# Patient Record
Sex: Female | Born: 1965 | Race: Black or African American | Hispanic: No | Marital: Single | Smoking: Never smoker
Health system: Southern US, Community
[De-identification: ages and names within clinical notes are randomized; demographics above are authoritative.]

## PROBLEM LIST (undated history)

## (undated) DIAGNOSIS — M25862 Other specified joint disorders, left knee: Secondary | ICD-10-CM

## (undated) DIAGNOSIS — L819 Disorder of pigmentation, unspecified: Secondary | ICD-10-CM

## (undated) DIAGNOSIS — M25561 Pain in right knee: Secondary | ICD-10-CM

## (undated) DIAGNOSIS — L709 Acne, unspecified: Secondary | ICD-10-CM

## (undated) DIAGNOSIS — L732 Hidradenitis suppurativa: Secondary | ICD-10-CM

## (undated) DIAGNOSIS — M25562 Pain in left knee: Secondary | ICD-10-CM

## (undated) DIAGNOSIS — M1990 Unspecified osteoarthritis, unspecified site: Secondary | ICD-10-CM

## (undated) HISTORY — PX: COLONOSCOPY: SHX174

## (undated) HISTORY — PX: THERAPEUTIC ABORTION: SHX798

---

## 2001-03-19 ENCOUNTER — Inpatient Hospital Stay (HOSPITAL_COMMUNITY): Admission: AD | Admit: 2001-03-19 | Discharge: 2001-03-19 | Payer: Self-pay | Admitting: Obstetrics and Gynecology

## 2002-01-27 ENCOUNTER — Other Ambulatory Visit: Admission: RE | Admit: 2002-01-27 | Discharge: 2002-01-27 | Payer: Self-pay | Admitting: Obstetrics and Gynecology

## 2003-05-07 ENCOUNTER — Other Ambulatory Visit: Admission: RE | Admit: 2003-05-07 | Discharge: 2003-05-07 | Payer: Self-pay | Admitting: Obstetrics and Gynecology

## 2004-11-25 ENCOUNTER — Other Ambulatory Visit: Admission: RE | Admit: 2004-11-25 | Discharge: 2004-11-25 | Payer: Self-pay | Admitting: Family Medicine

## 2005-11-29 ENCOUNTER — Other Ambulatory Visit: Admission: RE | Admit: 2005-11-29 | Discharge: 2005-11-29 | Payer: Self-pay | Admitting: Family Medicine

## 2007-01-28 ENCOUNTER — Other Ambulatory Visit: Admission: RE | Admit: 2007-01-28 | Discharge: 2007-01-28 | Payer: Self-pay | Admitting: Family Medicine

## 2008-05-19 ENCOUNTER — Other Ambulatory Visit: Admission: RE | Admit: 2008-05-19 | Discharge: 2008-05-19 | Payer: Self-pay | Admitting: Family Medicine

## 2010-10-31 ENCOUNTER — Other Ambulatory Visit: Admission: RE | Admit: 2010-10-31 | Discharge: 2010-10-31 | Payer: Self-pay | Admitting: Family Medicine

## 2013-03-10 ENCOUNTER — Other Ambulatory Visit (HOSPITAL_COMMUNITY)
Admission: RE | Admit: 2013-03-10 | Discharge: 2013-03-10 | Disposition: A | Discharge status: Home / Self Care | Payer: BC Managed Care – PPO | Source: Ambulatory Visit | Attending: Family Medicine | Admitting: Family Medicine

## 2013-03-10 ENCOUNTER — Other Ambulatory Visit: Payer: Self-pay | Admitting: Family Medicine

## 2013-03-10 DIAGNOSIS — Z1151 Encounter for screening for human papillomavirus (HPV): Secondary | ICD-10-CM | POA: Insufficient documentation

## 2013-03-10 DIAGNOSIS — Z124 Encounter for screening for malignant neoplasm of cervix: Secondary | ICD-10-CM | POA: Insufficient documentation

## 2013-03-11 ENCOUNTER — Other Ambulatory Visit: Payer: Self-pay | Admitting: Family Medicine

## 2013-03-11 DIAGNOSIS — N926 Irregular menstruation, unspecified: Secondary | ICD-10-CM

## 2013-03-19 ENCOUNTER — Ambulatory Visit
Admission: RE | Admit: 2013-03-19 | Discharge: 2013-03-19 | Disposition: A | Discharge status: Home / Self Care | Payer: BC Managed Care – PPO | Source: Ambulatory Visit | Attending: Family Medicine | Admitting: Family Medicine

## 2013-03-19 DIAGNOSIS — N926 Irregular menstruation, unspecified: Secondary | ICD-10-CM

## 2015-06-05 ENCOUNTER — Encounter: Payer: Self-pay | Admitting: Emergency Medicine

## 2015-06-05 ENCOUNTER — Emergency Department
Admission: EM | Admit: 2015-06-05 | Discharge: 2015-06-05 | Disposition: A | Discharge status: Home / Self Care | Payer: BLUE CROSS/BLUE SHIELD | Source: Home / Self Care | Attending: Family Medicine | Admitting: Family Medicine

## 2015-06-05 ENCOUNTER — Emergency Department (INDEPENDENT_AMBULATORY_CARE_PROVIDER_SITE_OTHER): Payer: BLUE CROSS/BLUE SHIELD

## 2015-06-05 DIAGNOSIS — J069 Acute upper respiratory infection, unspecified: Secondary | ICD-10-CM | POA: Diagnosis not present

## 2015-06-05 DIAGNOSIS — R0781 Pleurodynia: Secondary | ICD-10-CM

## 2015-06-05 DIAGNOSIS — R05 Cough: Secondary | ICD-10-CM

## 2015-06-05 MED ORDER — BENZONATATE 100 MG PO CAPS: 100.0000 mg | ORAL_CAPSULE | Freq: Three times a day (TID) | ORAL | Status: DC

## 2015-06-05 MED ORDER — SALINE SPRAY 0.65 % NA SOLN: 1.0000 | NASAL | Status: DC | PRN

## 2015-06-05 MED ORDER — NAPROXEN 500 MG PO TABS: 500.0000 mg | ORAL_TABLET | Freq: Two times a day (BID) | ORAL | Status: DC

## 2015-06-05 NOTE — ED Provider Notes (Signed)
CSN: [id number]     Arrival date & time 06/05/15  1803 History   First MD Initiated Contact with Patient 06/05/15 1823     Chief Complaint  Patient presents with  . Cough  . Fever   (Consider location/radiation/quality/duration/timing/severity/associated sxs/prior Treatment) HPI Patient is a 49 year old female presenting to urgent care with complaint of gradually worsening three-day history of subjective fever, cough, congestion, and body aches. Bilateral foot pain and back pain. Pain is described as aching sore with cough but denies difficulty breathing.  Pain is moderate in severity. Cough is moderate in severity with green and yellow sputum with tinges of red blood. Denies sick contacts or recent travel. Patient has tried over-the-counter cold and congestion medications without relief. Denies nausea vomiting diarrhea. Denies neck pain. Denies rashes. No tick bites.  History reviewed. No pertinent past medical history. History reviewed. No pertinent past surgical history. History reviewed. No pertinent family history. History  Substance Use Topics  . Smoking status: Never Smoker   . Smokeless tobacco: Not on file  . Alcohol Use: No   OB History    No data available     Review of Systems  Constitutional: Positive for fever, chills, diaphoresis, appetite change and fatigue. Negative for unexpected weight change.  HENT: Positive for congestion, sinus pressure, sore throat and voice change ( hoarseness  ). Negative for ear pain and trouble swallowing.   Respiratory: Positive for cough. Negative for shortness of breath.   Cardiovascular: Positive for chest pain (bilateral ribs). Negative for palpitations and leg swelling.  Gastrointestinal: Negative for nausea, vomiting, abdominal pain and diarrhea.  Musculoskeletal: Positive for myalgias, back pain and arthralgias. Negative for gait problem, neck pain and neck stiffness.    Allergies  Review of patient's allergies indicates not on  file.  Home Medications   Prior to Admission medications   Medication Sig Start Date End Date Taking? Authorizing Provider  benzonatate (TESSALON) 100 MG capsule Take 1 capsule (100 mg total) by mouth every 8 (eight) hours. 06/05/15   Noland Fordyce, PA-C  naproxen (NAPROSYN) 500 MG tablet Take 1 tablet (500 mg total) by mouth 2 (two) times daily. 06/05/15   Noland Fordyce, PA-C  sodium chloride (OCEAN) 0.65 % SOLN nasal spray Place 1 spray into both nostrils as needed for congestion. 06/05/15   Noland Fordyce, PA-C   Pulse 66  Temp(Src) 98 F (36.7 C) (Oral)  Ht 5\' 10"  (1.778 m)  Wt 219 lb (99.338 kg)  BMI 31.42 kg/m2  SpO2 96%  LMP 05/26/2015 Physical Exam  Constitutional: She appears well-developed and well-nourished. No distress.  HENT:  Head: Normocephalic and atraumatic.  Right Ear: Hearing, tympanic membrane, external ear and ear canal normal.  Left Ear: Hearing, tympanic membrane, external ear and ear canal normal.  Nose: Mucosal edema present.  Mouth/Throat: Uvula is midline and mucous membranes are normal. Posterior oropharyngeal erythema present. No oropharyngeal exudate, posterior oropharyngeal edema or tonsillar abscesses.  Eyes: Conjunctivae are normal. No scleral icterus.  Neck: Normal range of motion. Neck supple.  Cardiovascular: Normal rate, regular rhythm and normal heart sounds.   Pulmonary/Chest: Effort normal and breath sounds normal. No respiratory distress. She has no wheezes. She has no rales. She exhibits no tenderness.  No respiratory distress, able to speak in full sentences w/o difficulty. Lungs: CTAB. No chest wall tenderness  Abdominal: Soft. Bowel sounds are normal. She exhibits no distension and no mass. There is no tenderness. There is no rebound and no guarding.  Musculoskeletal: Normal range  of motion.  Neurological: She is alert.  Skin: Skin is warm and dry. She is not diaphoretic.  Nursing note and vitals reviewed.   ED Course  Procedures (including  critical care time) Labs Review Labs Reviewed - No data to display  Imaging Review Dg Chest 2 View  06/05/2015   CLINICAL DATA:  Acute onset of productive cough, bilateral rib pain and bilateral back pain. Initial encounter.  EXAM: CHEST  2 VIEW  COMPARISON:  None.  FINDINGS: The lungs are well-aerated. Pulmonary vascularity is at the upper limits of normal. There is no evidence of focal opacification, pleural effusion or pneumothorax.  The heart is normal in size; the mediastinal contour is within normal limits. No acute osseous abnormalities are seen.  IMPRESSION: No acute cardiopulmonary process seen.   Electronically Signed   By: Garald Balding M.D.   On: 06/05/2015 18:41     MDM   1. Acute upper respiratory infection     Patient complains of productive cough, congestion, bilateral chest pain, back pain, body aches, subjective fever. Has tried OTC medication without relief. Due to reports of fever chest pain and productive cough, there is concern for pneumonia. Chest x-ray.    CXR: no acute cardiopulmonary process seen.  Will tx symptomatically for URI. Home care instructions provided. Advised pt to use acetaminophen and ibuprofen as needed for fever and pain. Encouraged rest and fluids. Return precautions provided. Pt verbalized understanding and agreement with tx plan.    Noland Fordyce, PA-C 06/05/15 [phone]

## 2015-06-05 NOTE — ED Notes (Signed)
Cough cold and congestion with fever times 3 days. Complains of pain in bilateral ribs and back

## 2015-06-05 NOTE — Discharge Instructions (Signed)
You may take Ibuprofen (Motrin) every 6-8 hours for fever and pain  Alternate with Tylenol  You may take Tylenol every 4-6 hours as needed for fever and pain  Follow-up with your primary care provider next week for recheck of symptoms if not improving.  Be sure to drink plenty of fluids and rest, at least 8hrs of sleep a night, preferably more while you are sick. Please go to the ED if you cannot keep down fluids/signs of dehydration, fever not reducing with Tylenol, difficulty breathing/wheezing, stiff neck, worsening condition, or other concerns (see below)  [employer] may help relieve the symptoms of a cough and cold. They add moisture to the air, which helps mucus to become thinner and less sticky. This makes it easier to breathe and cough up secretions. Cool mist vaporizers do not cause serious burns like hot mist vaporizers, which may also be called steamers or humidifiers. Vaporizers have not been proven to help with colds. You should not use a vaporizer if you are allergic to mold. HOME CARE INSTRUCTIONS  Follow the package instructions for the vaporizer.  Do not use anything other than distilled water in the vaporizer.  Do not run the vaporizer all of the time. This can cause mold or bacteria to grow in the vaporizer.  Clean the vaporizer after each time it is used.  Clean and dry the vaporizer well before storing it.  Stop using the vaporizer if worsening respiratory symptoms develop. Document Released: 08/17/2004 Document Revised: 11/25/2013 Document Reviewed: 04/09/2013 [facility] Patient Information 2015 [place], [place]. This information is not intended to replace advice given to you by your health care provider. Make sure you discuss any questions you have with your health care provider.  Upper Respiratory Infection, Adult An upper respiratory infection (URI) is also sometimes known as the common cold. The upper respiratory tract includes the nose, sinuses,  throat, trachea, and bronchi. Bronchi are the airways leading to the lungs. Most people improve within 1 week, but symptoms can last up to 2 weeks. A residual cough may last even longer.  CAUSES Many different viruses can infect the tissues lining the upper respiratory tract. The tissues become irritated and inflamed and often become very moist. Mucus production is also common. A cold is contagious. You can easily spread the virus to others by oral contact. This includes kissing, sharing a glass, coughing, or sneezing. Touching your mouth or nose and then touching a surface, which is then touched by another person, can also spread the virus. SYMPTOMS  Symptoms typically develop 1 to 3 days after you come in contact with a cold virus. Symptoms vary from person to person. They may include:  Runny nose.  Sneezing.  Nasal congestion.  Sinus irritation.  Sore throat.  Loss of voice (laryngitis).  Cough.  Fatigue.  Muscle aches.  Loss of appetite.  Headache.  Low-grade fever. DIAGNOSIS  You might diagnose your own cold based on familiar symptoms, since most people get a cold 2 to 3 times a year. Your caregiver can confirm this based on your exam. Most importantly, your caregiver can check that your symptoms are not due to another disease such as strep throat, sinusitis, pneumonia, asthma, or epiglottitis. Blood tests, throat tests, and X-rays are not necessary to diagnose a common cold, but they may sometimes be helpful in excluding other more serious diseases. Your caregiver will decide if any further tests are required. RISKS AND COMPLICATIONS  You may be at risk for a more  severe case of the common cold if you smoke cigarettes, have chronic heart disease (such as heart failure) or lung disease (such as asthma), or if you have a weakened immune system. The very young and very old are also at risk for more serious infections. Bacterial sinusitis, middle ear infections, and bacterial  pneumonia can complicate the common cold. The common cold can worsen asthma and chronic obstructive pulmonary disease (COPD). Sometimes, these complications can require emergency medical care and may be life-threatening. PREVENTION  The best way to protect against getting a cold is to practice good hygiene. Avoid oral or hand contact with people with cold symptoms. Wash your hands often if contact occurs. There is no clear evidence that vitamin C, vitamin E, echinacea, or exercise reduces the chance of developing a cold. However, it is always recommended to get plenty of rest and practice good nutrition. TREATMENT  Treatment is directed at relieving symptoms. There is no cure. Antibiotics are not effective, because the infection is caused by a virus, not by bacteria. Treatment may include:  Increased fluid intake. Sports drinks offer valuable electrolytes, sugars, and fluids.  Breathing heated mist or steam (vaporizer or shower).  Eating chicken soup or other clear broths, and maintaining good nutrition.  Getting plenty of rest.  Using gargles or lozenges for comfort.  Controlling fevers with ibuprofen or acetaminophen as directed by your caregiver.  Increasing usage of your inhaler if you have asthma. Zinc gel and zinc lozenges, taken in the first 24 hours of the common cold, can shorten the duration and lessen the severity of symptoms. Pain medicines may help with fever, muscle aches, and throat pain. A variety of non-prescription medicines are available to treat congestion and runny nose. Your caregiver can make recommendations and may suggest nasal or lung inhalers for other symptoms.  HOME CARE INSTRUCTIONS   Only take over-the-counter or prescription medicines for pain, discomfort, or fever as directed by your caregiver.  Use a warm mist humidifier or inhale steam from a shower to increase air moisture. This may keep secretions moist and make it easier to breathe.  Drink enough water  and fluids to keep your urine clear or pale yellow.  Rest as needed.  Return to work when your temperature has returned to normal or as your caregiver advises. You may need to stay home longer to avoid infecting others. You can also use a face mask and careful hand washing to prevent spread of the virus. SEEK MEDICAL CARE IF:   After the first few days, you feel you are getting worse rather than better.  You need your caregiver's advice about medicines to control symptoms.  You develop chills, worsening shortness of breath, or brown or red sputum. These may be signs of pneumonia.  You develop yellow or brown nasal discharge or pain in the face, especially when you bend forward. These may be signs of sinusitis.  You develop a fever, swollen neck glands, pain with swallowing, or white areas in the back of your throat. These may be signs of strep throat. SEEK IMMEDIATE MEDICAL CARE IF:   You have a fever.  You develop severe or persistent headache, ear pain, sinus pain, or chest pain.  You develop wheezing, a prolonged cough, cough up blood, or have a change in your usual mucus (if you have chronic lung disease).  You develop sore muscles or a stiff neck. Document Released: 05/16/2001 Document Revised: 02/12/2012 Document Reviewed: 02/25/2014 [facility] Patient Information 2015 [place], [place]. This information  is not intended to replace advice given to you by your health care provider. Make sure you discuss any questions you have with your health care provider.

## 2016-04-25 ENCOUNTER — Other Ambulatory Visit: Payer: Self-pay | Admitting: Family Medicine

## 2016-04-25 DIAGNOSIS — N926 Irregular menstruation, unspecified: Secondary | ICD-10-CM

## 2016-05-05 ENCOUNTER — Ambulatory Visit
Admission: RE | Admit: 2016-05-05 | Discharge: 2016-05-05 | Disposition: A | Discharge status: Home / Self Care | Payer: BLUE CROSS/BLUE SHIELD | Source: Ambulatory Visit | Attending: Family Medicine | Admitting: Family Medicine

## 2016-05-05 DIAGNOSIS — N926 Irregular menstruation, unspecified: Secondary | ICD-10-CM

## 2017-01-02 DIAGNOSIS — L732 Hidradenitis suppurativa: Secondary | ICD-10-CM | POA: Diagnosis not present

## 2017-01-02 DIAGNOSIS — L819 Disorder of pigmentation, unspecified: Secondary | ICD-10-CM | POA: Diagnosis not present

## 2017-01-02 DIAGNOSIS — L700 Acne vulgaris: Secondary | ICD-10-CM | POA: Diagnosis not present

## 2017-06-14 ENCOUNTER — Other Ambulatory Visit: Payer: Self-pay | Admitting: Family Medicine

## 2017-06-14 DIAGNOSIS — N926 Irregular menstruation, unspecified: Secondary | ICD-10-CM | POA: Diagnosis not present

## 2017-06-14 DIAGNOSIS — Z23 Encounter for immunization: Secondary | ICD-10-CM | POA: Diagnosis not present

## 2017-06-14 DIAGNOSIS — N921 Excessive and frequent menstruation with irregular cycle: Secondary | ICD-10-CM | POA: Diagnosis not present

## 2017-06-15 DIAGNOSIS — Z0000 Encounter for general adult medical examination without abnormal findings: Secondary | ICD-10-CM | POA: Diagnosis not present

## 2017-06-15 DIAGNOSIS — N938 Other specified abnormal uterine and vaginal bleeding: Secondary | ICD-10-CM | POA: Diagnosis not present

## 2017-06-21 DIAGNOSIS — Z23 Encounter for immunization: Secondary | ICD-10-CM | POA: Diagnosis not present

## 2017-06-22 DIAGNOSIS — N920 Excessive and frequent menstruation with regular cycle: Secondary | ICD-10-CM | POA: Diagnosis not present

## 2017-06-22 DIAGNOSIS — N938 Other specified abnormal uterine and vaginal bleeding: Secondary | ICD-10-CM | POA: Diagnosis not present

## 2017-06-22 DIAGNOSIS — D259 Leiomyoma of uterus, unspecified: Secondary | ICD-10-CM | POA: Diagnosis not present

## 2017-06-26 ENCOUNTER — Other Ambulatory Visit: Payer: Self-pay | Admitting: Family Medicine

## 2017-06-26 DIAGNOSIS — N926 Irregular menstruation, unspecified: Secondary | ICD-10-CM

## 2017-07-02 DIAGNOSIS — D259 Leiomyoma of uterus, unspecified: Secondary | ICD-10-CM | POA: Diagnosis not present

## 2017-07-02 DIAGNOSIS — N926 Irregular menstruation, unspecified: Secondary | ICD-10-CM | POA: Diagnosis not present

## 2017-07-26 DIAGNOSIS — Z23 Encounter for immunization: Secondary | ICD-10-CM | POA: Diagnosis not present

## 2017-08-03 DIAGNOSIS — Z1231 Encounter for screening mammogram for malignant neoplasm of breast: Secondary | ICD-10-CM | POA: Diagnosis not present

## 2017-08-07 DIAGNOSIS — Z131 Encounter for screening for diabetes mellitus: Secondary | ICD-10-CM | POA: Diagnosis not present

## 2017-08-07 DIAGNOSIS — D508 Other iron deficiency anemias: Secondary | ICD-10-CM | POA: Diagnosis not present

## 2017-08-07 DIAGNOSIS — Z23 Encounter for immunization: Secondary | ICD-10-CM | POA: Diagnosis not present

## 2017-08-07 DIAGNOSIS — Z0000 Encounter for general adult medical examination without abnormal findings: Secondary | ICD-10-CM | POA: Diagnosis not present

## 2017-08-13 ENCOUNTER — Other Ambulatory Visit: Payer: Self-pay | Admitting: Obstetrics and Gynecology

## 2017-08-13 ENCOUNTER — Other Ambulatory Visit (HOSPITAL_COMMUNITY)
Admission: RE | Admit: 2017-08-13 | Discharge: 2017-08-13 | Disposition: A | Discharge status: Home / Self Care | Payer: BLUE CROSS/BLUE SHIELD | Source: Ambulatory Visit | Attending: Obstetrics and Gynecology | Admitting: Obstetrics and Gynecology

## 2017-08-13 DIAGNOSIS — N939 Abnormal uterine and vaginal bleeding, unspecified: Secondary | ICD-10-CM | POA: Diagnosis not present

## 2017-08-13 DIAGNOSIS — R87810 Cervical high risk human papillomavirus (HPV) DNA test positive: Secondary | ICD-10-CM | POA: Insufficient documentation

## 2017-08-13 DIAGNOSIS — Z124 Encounter for screening for malignant neoplasm of cervix: Secondary | ICD-10-CM | POA: Diagnosis not present

## 2017-08-13 DIAGNOSIS — D219 Benign neoplasm of connective and other soft tissue, unspecified: Secondary | ICD-10-CM | POA: Diagnosis not present

## 2017-08-16 LAB — CYTOLOGY - PAP
Diagnosis: NEGATIVE
HPV 16/18/45 GENOTYPING: NEGATIVE
HPV: DETECTED — AB

## 2017-08-21 DIAGNOSIS — Z3202 Encounter for pregnancy test, result negative: Secondary | ICD-10-CM | POA: Diagnosis not present

## 2017-08-21 DIAGNOSIS — N9089 Other specified noninflammatory disorders of vulva and perineum: Secondary | ICD-10-CM | POA: Diagnosis not present

## 2017-08-21 DIAGNOSIS — N939 Abnormal uterine and vaginal bleeding, unspecified: Secondary | ICD-10-CM | POA: Diagnosis not present

## 2017-09-22 IMAGING — US US TRANSVAGINAL NON-OB
1 series · 13 of 25 positions shown · non-contrast
Comparison: Pelvic ultrasound March 19, 2013.

CLINICAL DATA: Painless irregular menses ; intermenstrual spotting
5 months ago, history of fibroids on past ultrasound.



[Series 1: us transvaginal non-ob · 0.20mm/px · 13 of 31 slices shown]
[im 1/31]
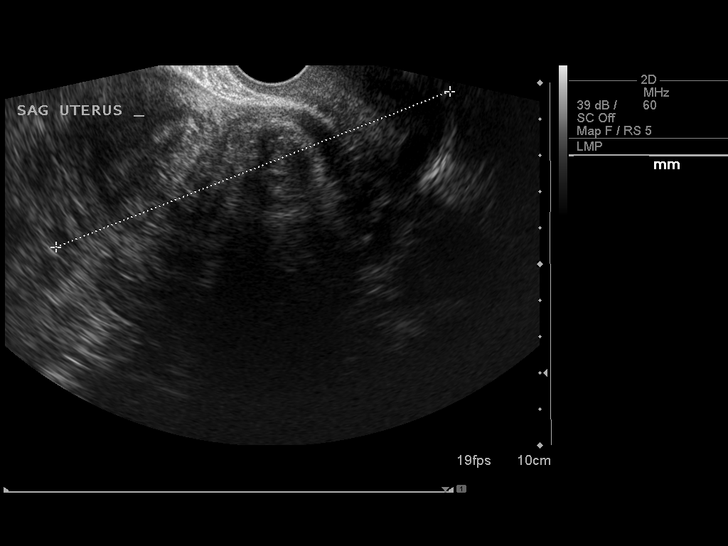
[im 3/31]
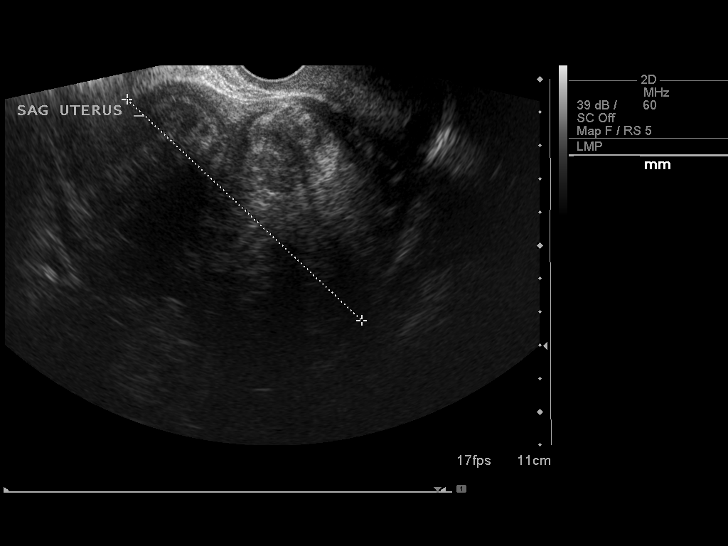
[im 6/31]
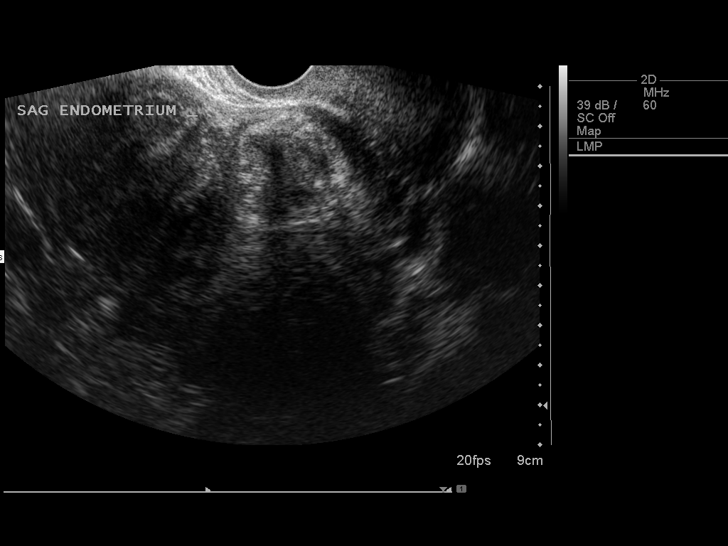
[im 8/31]
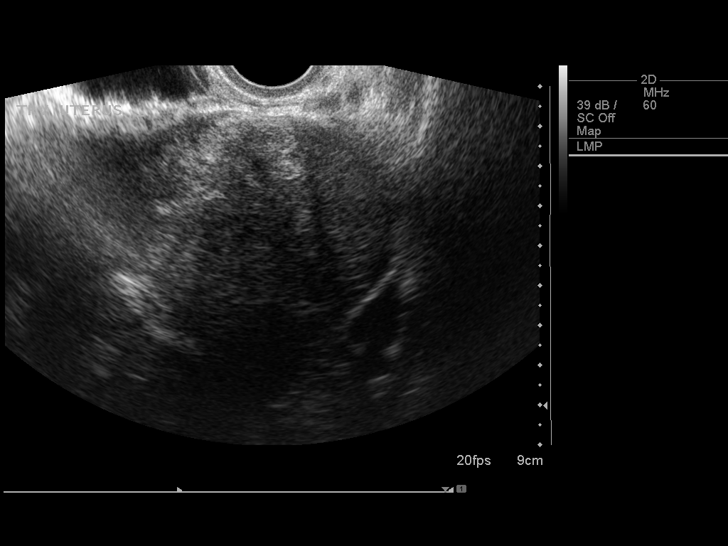
[im 11/31]
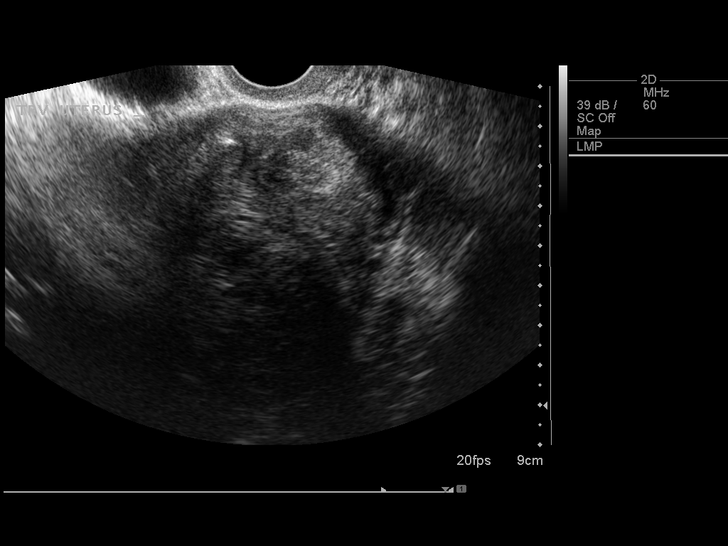
[im 13/31]
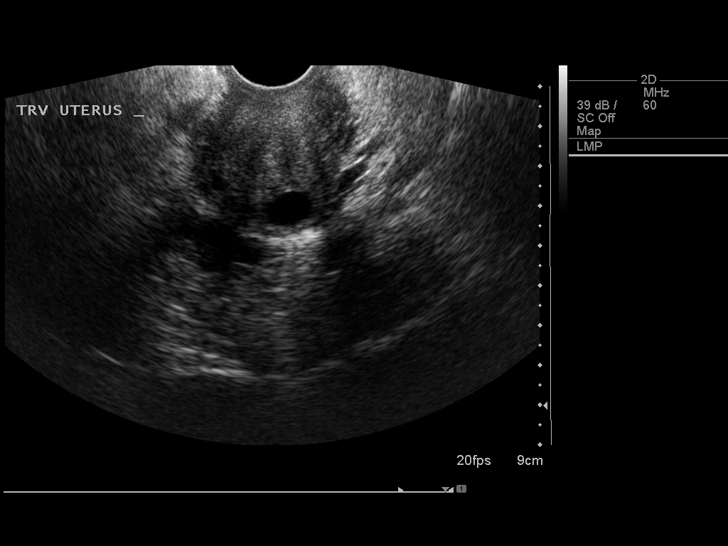
[im 16/31]
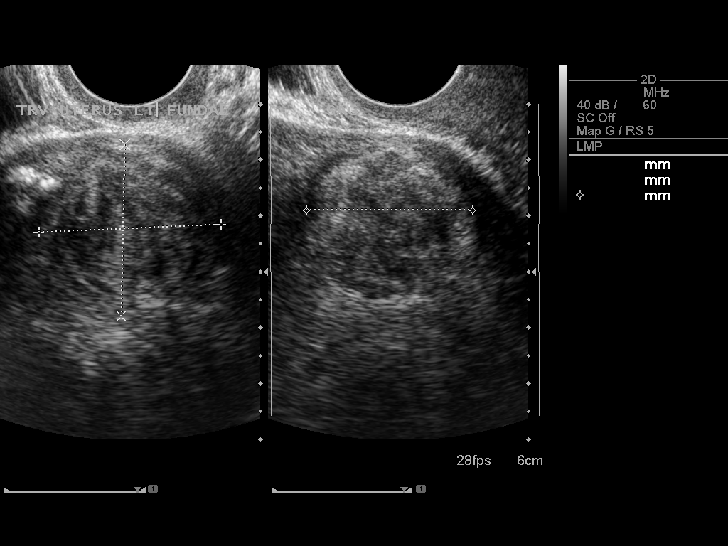
[im 18/31]
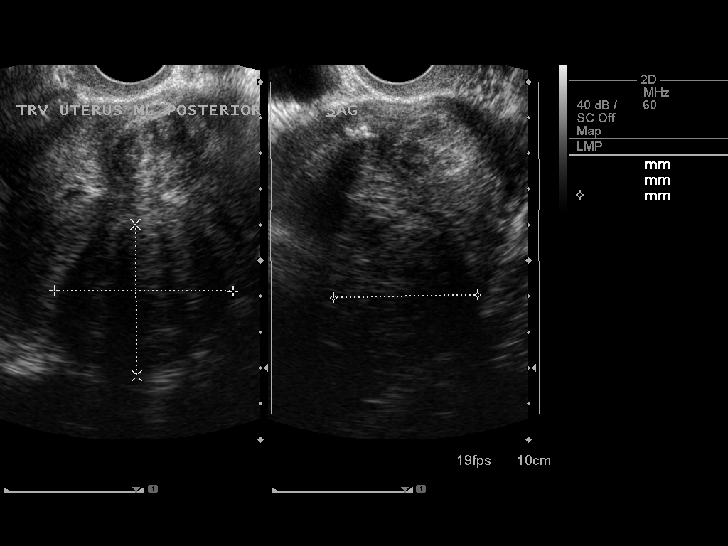
[im 21/31]
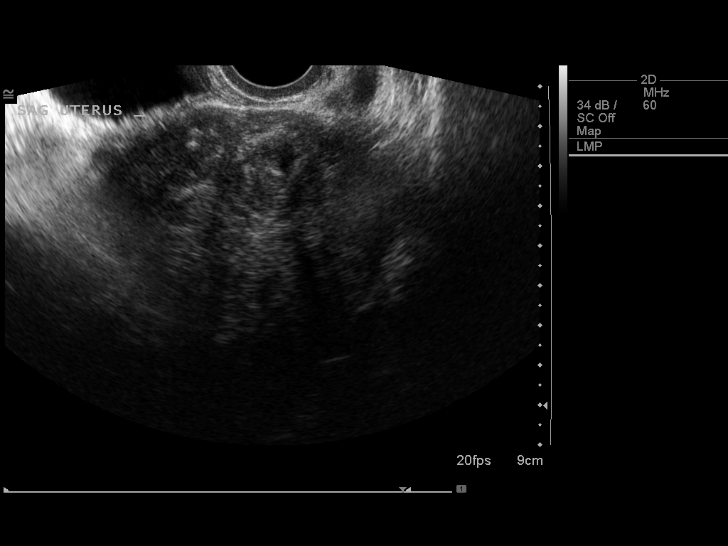
[im 23/31]
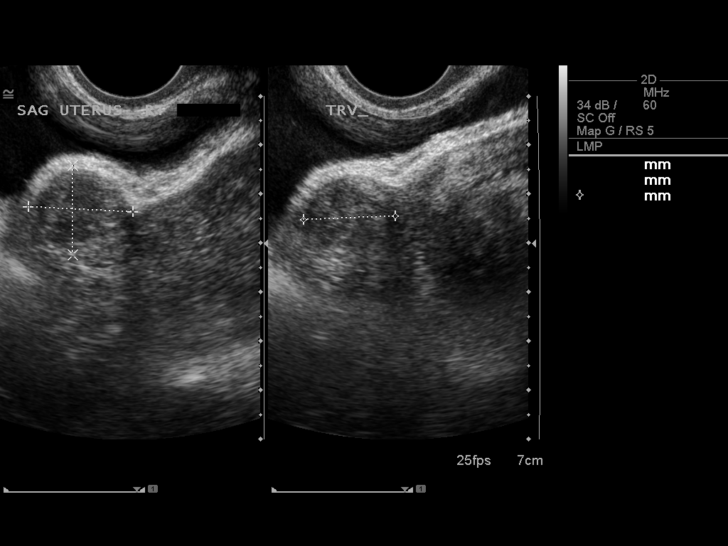
[im 26/31]
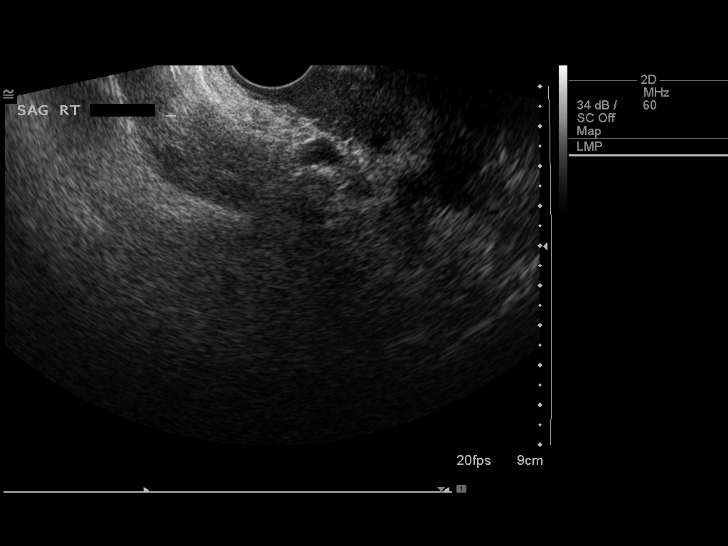
[im 28/31]
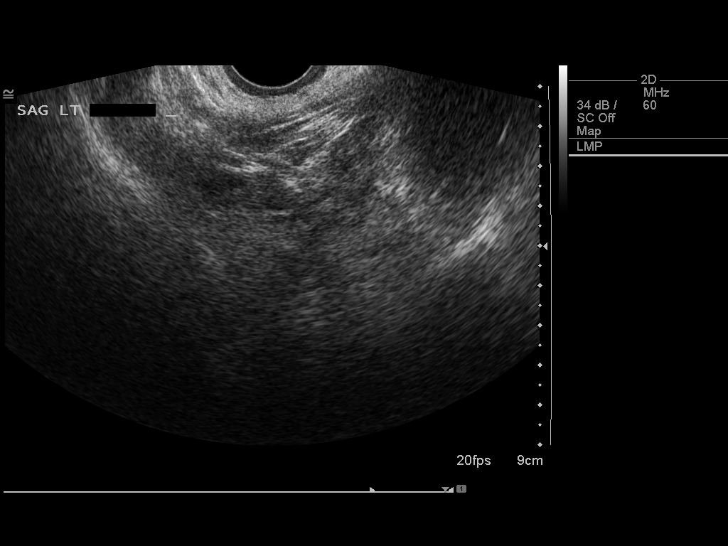
[im 31/31]
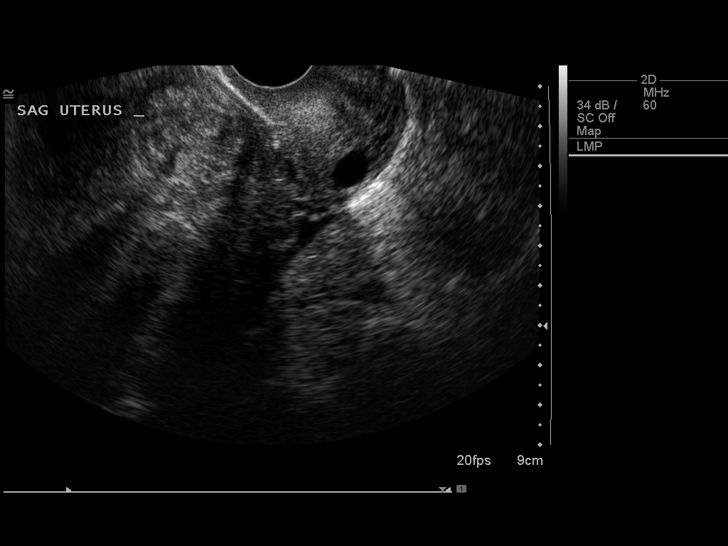

[13 of 25 positions shown; findings below may reference images not displayed]

FINDINGS: Uterus

Measurements: 12.5 x 11.6 x 10.0 cm. There are at least 7 measurable
fibroids. There is are sub serosal as well as intra myometrial and 7
endometrial. A pedunculated fibroid arising from the fundus measures
2.8 cm in greatest dimension. A subserosal fibroid in the mid to
lower uterine segment posteriorly measures 4.3 x 6.0 x 4.5 cm. Other
fibroids are smaller than this.

Endometrium

Thickness: 4.4 mm.  No focal abnormality visualized.

Neither the right nor left ovary could be observed.

Other findings

No abnormal free fluid.
IMPRESSION: 1. At least 7 discrete fibroids can be demonstrated and measured.
The largest measures 4.3 x 6.0 x 4.5 cm and lies in the posterior
aspect of the mid to lower uterine body and is sub serosal.
2. The endometrial stripe is normal at 4.4 mm.
3. The ovaries could not be demonstrated. There is no free pelvic
fluid. No suspicious adnexal masses are observed.

## 2017-09-22 IMAGING — US US TRANSVAGINAL NON-OB
1 series · 13 of 24 positions shown · non-contrast
Comparison: Pelvic ultrasound March 19, 2013.

CLINICAL DATA: Painless irregular menses ; intermenstrual spotting
5 months ago, history of fibroids on past ultrasound.



[Series 1: us transvaginal non-ob · 0.28mm/px · 13 of 24 slices shown]
[im 1/24]
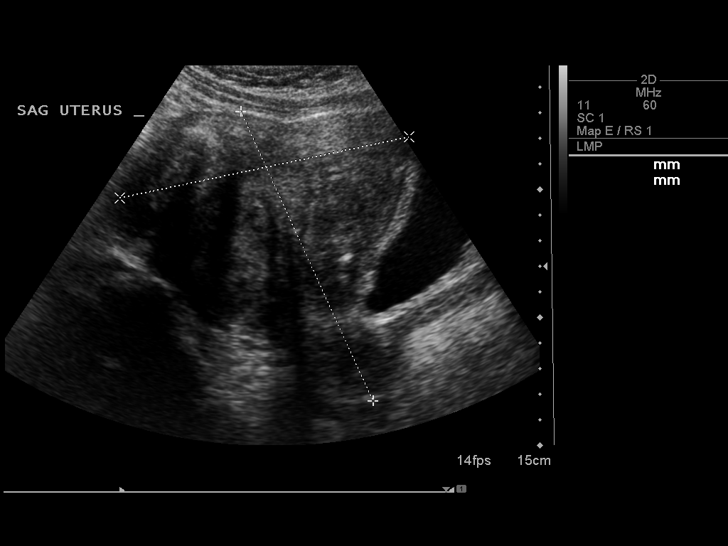
[im 3/24]
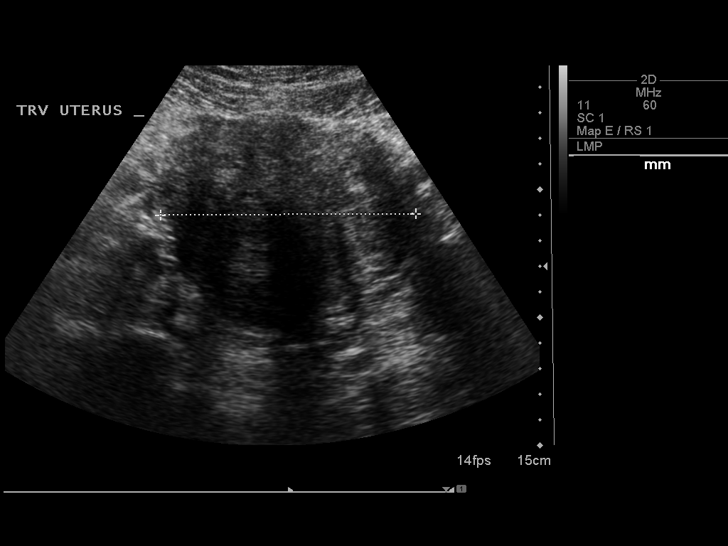
[im 5/24]
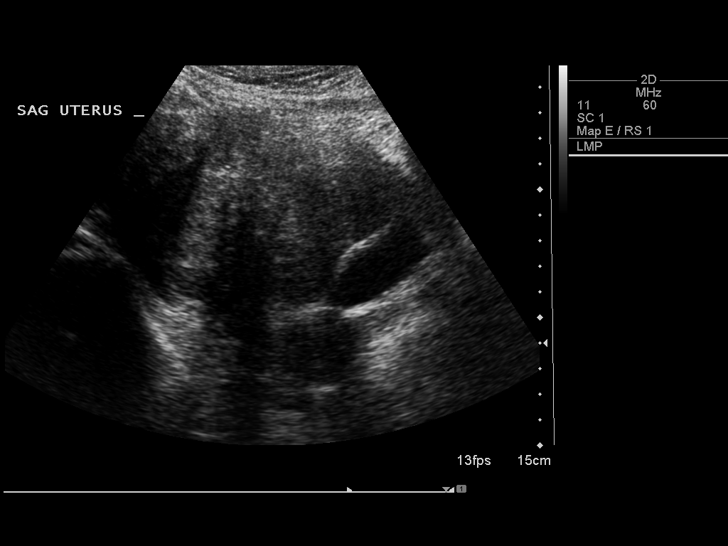
[im 7/24]
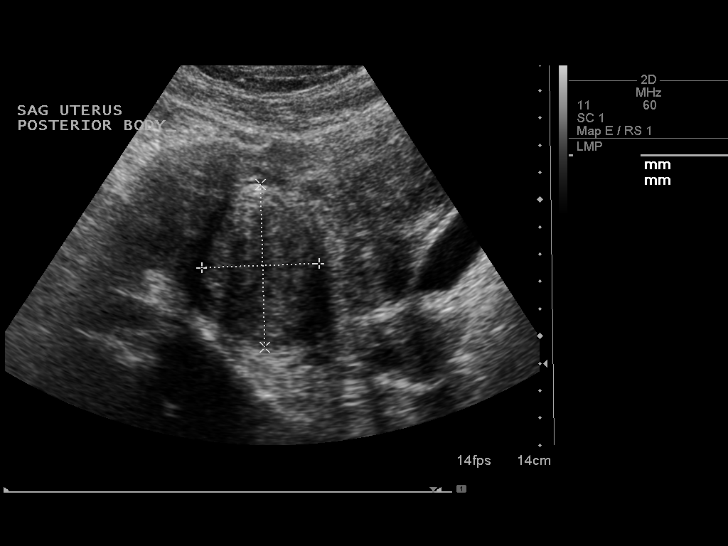
[im 9/24]
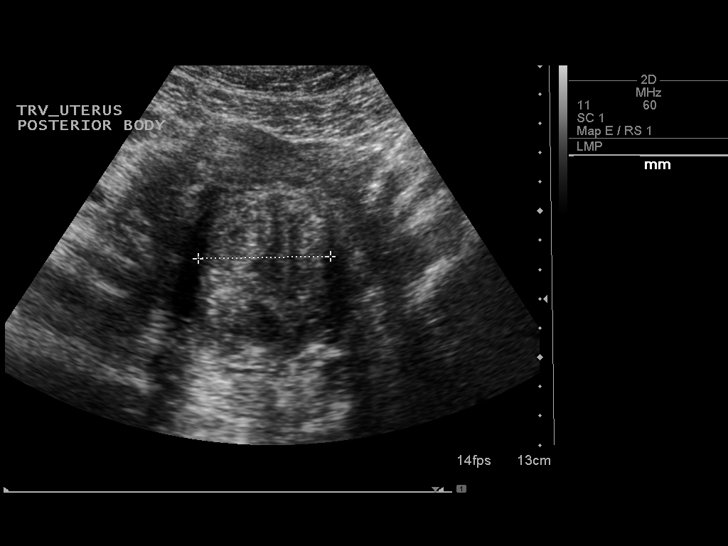
[im 11/24]
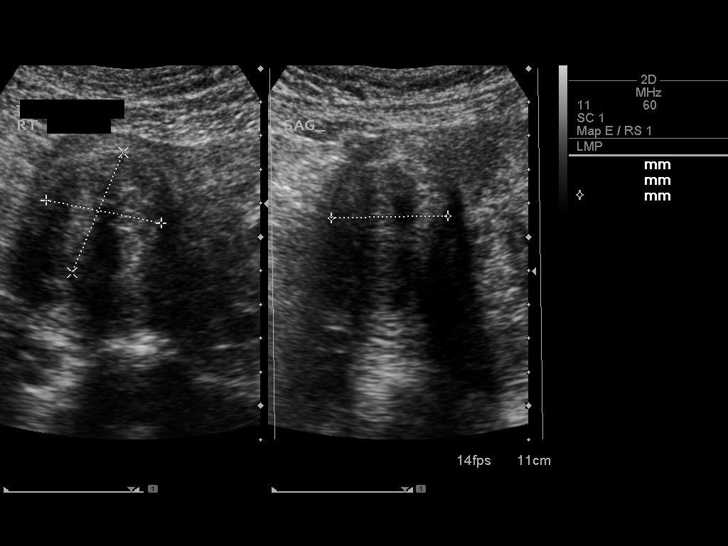
[im 13/24]
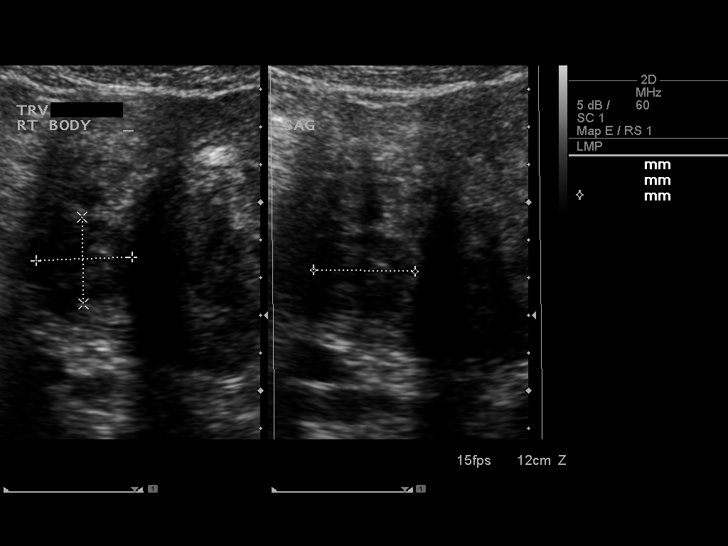
[im 14/24]
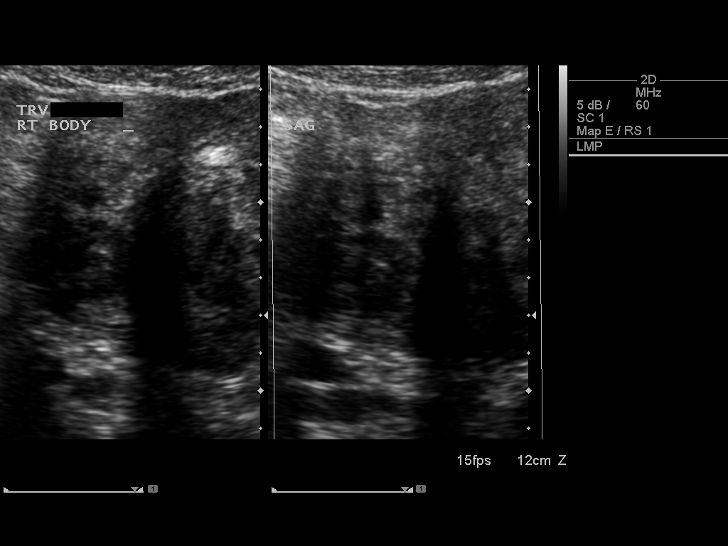
[im 16/24]
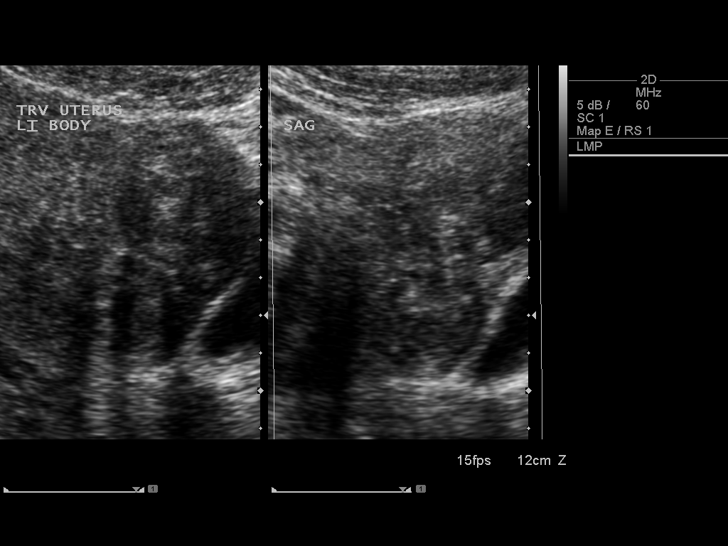
[im 18/24]
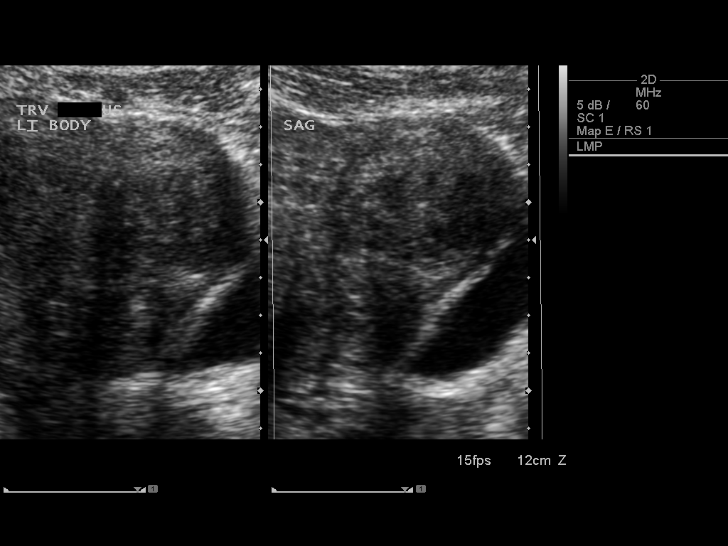
[im 20/24]
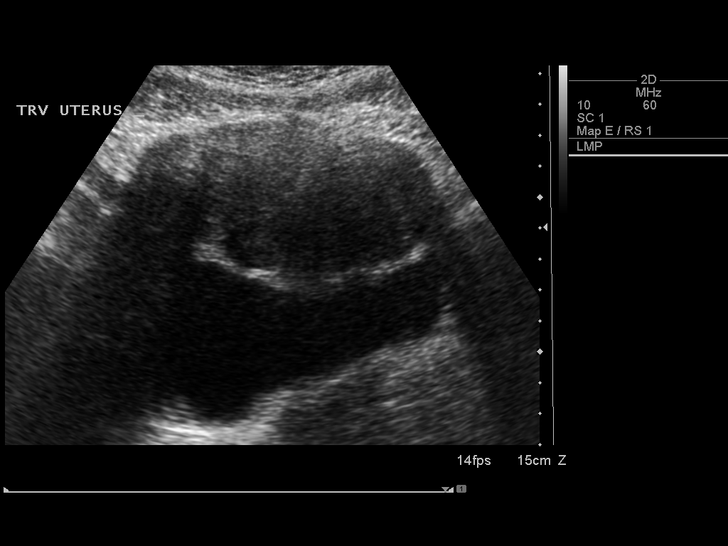
[im 22/24]
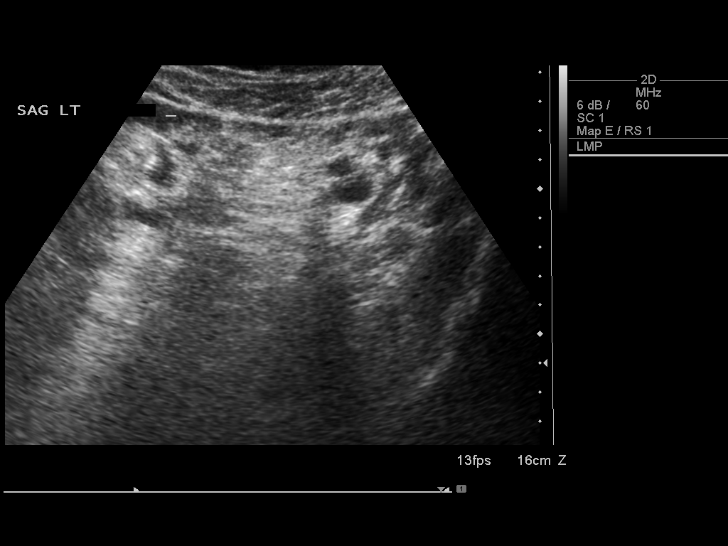
[im 24/24]
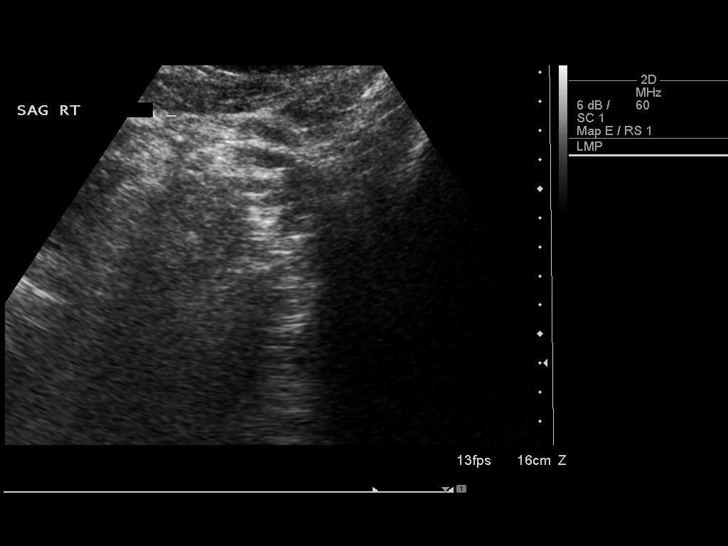

[13 of 24 positions shown; findings below may reference images not displayed]

FINDINGS: Uterus

Measurements: 12.5 x 11.6 x 10.0 cm. There are at least 7 measurable
fibroids. There is are sub serosal as well as intra myometrial and 7
endometrial. A pedunculated fibroid arising from the fundus measures
2.8 cm in greatest dimension. A subserosal fibroid in the mid to
lower uterine segment posteriorly measures 4.3 x 6.0 x 4.5 cm. Other
fibroids are smaller than this.

Endometrium

Thickness: 4.4 mm.  No focal abnormality visualized.

Neither the right nor left ovary could be observed.

Other findings

No abnormal free fluid.
IMPRESSION: 1. At least 7 discrete fibroids can be demonstrated and measured.
The largest measures 4.3 x 6.0 x 4.5 cm and lies in the posterior
aspect of the mid to lower uterine body and is sub serosal.
2. The endometrial stripe is normal at 4.4 mm.
3. The ovaries could not be demonstrated. There is no free pelvic
fluid. No suspicious adnexal masses are observed.

## 2017-12-11 ENCOUNTER — Other Ambulatory Visit: Payer: Self-pay | Admitting: Family Medicine

## 2017-12-11 DIAGNOSIS — N926 Irregular menstruation, unspecified: Secondary | ICD-10-CM

## 2017-12-31 ENCOUNTER — Encounter (HOSPITAL_BASED_OUTPATIENT_CLINIC_OR_DEPARTMENT_OTHER): Payer: Self-pay

## 2017-12-31 ENCOUNTER — Other Ambulatory Visit: Payer: Self-pay

## 2017-12-31 DIAGNOSIS — N939 Abnormal uterine and vaginal bleeding, unspecified: Secondary | ICD-10-CM | POA: Diagnosis not present

## 2017-12-31 DIAGNOSIS — Z01818 Encounter for other preprocedural examination: Secondary | ICD-10-CM | POA: Diagnosis not present

## 2017-12-31 DIAGNOSIS — D219 Benign neoplasm of connective and other soft tissue, unspecified: Secondary | ICD-10-CM | POA: Diagnosis not present

## 2017-12-31 NOTE — Progress Notes (Signed)
Spoke with: Latecia NPO:  No food after midnight/Clear liquids until 8:00AM DOS Arrival time: 12Noon Labs: Hemoglobin AM medications: None Pre op orders: No Ride home: [place] (friend) [phone] or [phone]

## 2017-12-31 NOTE — H&P (View-Only) (Signed)
Spoke with: Jameshia NPO:  No food after midnight/Clear liquids until 8:00AM DOS Arrival time: 12Noon Labs: Hemoglobin AM medications: None Pre op orders: No Ride home: [place] (friend) [phone] or [phone]

## 2018-01-01 ENCOUNTER — Encounter (HOSPITAL_BASED_OUTPATIENT_CLINIC_OR_DEPARTMENT_OTHER): Payer: Self-pay | Admitting: *Deleted

## 2018-01-01 ENCOUNTER — Ambulatory Visit (HOSPITAL_COMMUNITY)
Admission: RE | Admit: 2018-01-01 | Discharge: 2018-01-01 | Disposition: A | Discharge status: Home / Self Care | Payer: BLUE CROSS/BLUE SHIELD | Source: Ambulatory Visit | Attending: Obstetrics and Gynecology | Admitting: Obstetrics and Gynecology

## 2018-01-01 ENCOUNTER — Other Ambulatory Visit: Payer: Self-pay

## 2018-01-01 ENCOUNTER — Ambulatory Visit (HOSPITAL_BASED_OUTPATIENT_CLINIC_OR_DEPARTMENT_OTHER): Payer: BLUE CROSS/BLUE SHIELD | Admitting: Anesthesiology

## 2018-01-01 ENCOUNTER — Encounter (HOSPITAL_BASED_OUTPATIENT_CLINIC_OR_DEPARTMENT_OTHER)
Admission: RE | Disposition: A | Discharge status: Home / Self Care | Payer: Self-pay | Source: Ambulatory Visit | Attending: Obstetrics and Gynecology

## 2018-01-01 DIAGNOSIS — N939 Abnormal uterine and vaginal bleeding, unspecified: Secondary | ICD-10-CM | POA: Diagnosis not present

## 2018-01-01 DIAGNOSIS — D259 Leiomyoma of uterus, unspecified: Secondary | ICD-10-CM | POA: Diagnosis not present

## 2018-01-01 HISTORY — DX: Unspecified osteoarthritis, unspecified site: M19.90

## 2018-01-01 HISTORY — DX: Hidradenitis suppurativa: L73.2

## 2018-01-01 HISTORY — DX: Acne, unspecified: L70.9

## 2018-01-01 HISTORY — DX: Disorder of pigmentation, unspecified: L81.9

## 2018-01-01 HISTORY — DX: Other specified joint disorders, left knee: M25.862

## 2018-01-01 HISTORY — DX: Pain in left knee: M25.561

## 2018-01-01 HISTORY — DX: Pain in left knee: M25.562

## 2018-01-01 HISTORY — PX: DILITATION & CURRETTAGE/HYSTROSCOPY WITH HYDROTHERMAL ABLATION: SHX5570

## 2018-01-01 LAB — POCT PREGNANCY, URINE: Preg Test, Ur: NEGATIVE

## 2018-01-01 LAB — CBC
HEMATOCRIT: 38.8 % (ref 36.0–46.0)
HEMOGLOBIN: 12.9 g/dL (ref 12.0–15.0)
MCH: 28 pg (ref 26.0–34.0)
MCHC: 33.2 g/dL (ref 30.0–36.0)
MCV: 84.2 fL (ref 78.0–100.0)
Platelets: 269 10*3/uL (ref 150–400)
RBC: 4.61 MIL/uL (ref 3.87–5.11)
RDW: 14.8 % (ref 11.5–15.5)
WBC: 4.2 10*3/uL (ref 4.0–10.5)

## 2018-01-01 SURGERY — DILATATION & CURETTAGE/HYSTEROSCOPY WITH HYDROTHERMAL ABLATION
Anesthesia: General

## 2018-01-01 MED ORDER — LIDOCAINE HCL 2 % IJ SOLN
INTRAMUSCULAR | Status: DC | PRN
  Administered 2018-01-01: 10 mL

## 2018-01-01 MED ORDER — EPHEDRINE 5 MG/ML INJ
INTRAVENOUS | Status: AC
  Filled 2018-01-01: qty 10

## 2018-01-01 MED ORDER — DEXAMETHASONE SODIUM PHOSPHATE 10 MG/ML IJ SOLN
INTRAMUSCULAR | Status: DC | PRN
  Administered 2018-01-01: 10 mg via INTRAVENOUS

## 2018-01-01 MED ORDER — MIDAZOLAM HCL 2 MG/2ML IJ SOLN
INTRAMUSCULAR | Status: AC
Start: 2018-01-01 — End: 2018-01-01
  Filled 2018-01-01: qty 2

## 2018-01-01 MED ORDER — KETOROLAC TROMETHAMINE 30 MG/ML IJ SOLN
INTRAMUSCULAR | Status: DC | PRN
  Administered 2018-01-01: 30 mg via INTRAVENOUS

## 2018-01-01 MED ORDER — KETOROLAC TROMETHAMINE 30 MG/ML IJ SOLN
INTRAMUSCULAR | Status: AC
Start: 2018-01-01 — End: 2018-01-01
  Filled 2018-01-01: qty 1

## 2018-01-01 MED ORDER — FENTANYL CITRATE (PF) 100 MCG/2ML IJ SOLN
INTRAMUSCULAR | Status: AC
  Filled 2018-01-01: qty 2

## 2018-01-01 MED ORDER — PROMETHAZINE HCL 25 MG/ML IJ SOLN
6.2500 mg | INTRAMUSCULAR | Status: DC | PRN
  Filled 2018-01-01: qty 1

## 2018-01-01 MED ORDER — ONDANSETRON HCL 4 MG/2ML IJ SOLN
INTRAMUSCULAR | Status: DC | PRN
  Administered 2018-01-01: 4 mg via INTRAVENOUS

## 2018-01-01 MED ORDER — DEXAMETHASONE SODIUM PHOSPHATE 10 MG/ML IJ SOLN
INTRAMUSCULAR | Status: AC
  Filled 2018-01-01: qty 1

## 2018-01-01 MED ORDER — OXYCODONE HCL 5 MG PO TABS
5.0000 mg | ORAL_TABLET | Freq: Once | ORAL | Status: DC | PRN
  Filled 2018-01-01: qty 1

## 2018-01-01 MED ORDER — FENTANYL CITRATE (PF) 100 MCG/2ML IJ SOLN
INTRAMUSCULAR | Status: DC | PRN
  Administered 2018-01-01 (×2): 25 ug via INTRAVENOUS
  Administered 2018-01-01: 50 ug via INTRAVENOUS

## 2018-01-01 MED ORDER — LACTATED RINGERS IV SOLN
INTRAVENOUS | Status: DC
  Administered 2018-01-01 (×2): via INTRAVENOUS
  Filled 2018-01-01: qty 1000

## 2018-01-01 MED ORDER — MEPERIDINE HCL 25 MG/ML IJ SOLN
6.2500 mg | INTRAMUSCULAR | Status: DC | PRN
  Filled 2018-01-01: qty 1

## 2018-01-01 MED ORDER — OXYCODONE HCL 5 MG/5ML PO SOLN
5.0000 mg | Freq: Once | ORAL | Status: DC | PRN
  Filled 2018-01-01: qty 5

## 2018-01-01 MED ORDER — LIDOCAINE 2% (20 MG/ML) 5 ML SYRINGE
INTRAMUSCULAR | Status: AC
  Filled 2018-01-01: qty 5

## 2018-01-01 MED ORDER — LIDOCAINE 2% (20 MG/ML) 5 ML SYRINGE
INTRAMUSCULAR | Status: DC | PRN
  Administered 2018-01-01: 80 mg via INTRAVENOUS

## 2018-01-01 MED ORDER — PROPOFOL 10 MG/ML IV BOLUS
INTRAVENOUS | Status: DC | PRN
  Administered 2018-01-01: 200 mg via INTRAVENOUS

## 2018-01-01 MED ORDER — ONDANSETRON HCL 4 MG/2ML IJ SOLN
INTRAMUSCULAR | Status: AC
  Filled 2018-01-01: qty 2

## 2018-01-01 MED ORDER — MIDAZOLAM HCL 2 MG/2ML IJ SOLN
INTRAMUSCULAR | Status: DC | PRN
  Administered 2018-01-01: 2 mg via INTRAVENOUS

## 2018-01-01 MED ORDER — HYDROMORPHONE HCL 1 MG/ML IJ SOLN
0.2500 mg | INTRAMUSCULAR | Status: DC | PRN
Start: 2018-01-01 — End: 2018-01-01
  Filled 2018-01-01: qty 0.5

## 2018-01-01 MED ORDER — IBUPROFEN 600 MG PO TABS: 600.0000 mg | ORAL_TABLET | Freq: Four times a day (QID) | ORAL | 0 refills | Status: DC | PRN

## 2018-01-01 MED ORDER — PROPOFOL 10 MG/ML IV BOLUS
INTRAVENOUS | Status: AC
  Filled 2018-01-01: qty 20

## 2018-01-01 MED ORDER — EPHEDRINE SULFATE-NACL 50-0.9 MG/10ML-% IV SOSY
PREFILLED_SYRINGE | INTRAVENOUS | Status: DC | PRN
  Administered 2018-01-01 (×2): 10 mg via INTRAVENOUS

## 2018-01-01 SURGICAL SUPPLY — 17 items
CANISTER SUCT 3000ML PPV (MISCELLANEOUS) ×2 IMPLANT
CATH ROBINSON RED A/P 16FR (CATHETERS) ×2 IMPLANT
CLOTH BEACON ORANGE TIMEOUT ST (SAFETY) ×2 IMPLANT
CONTAINER PREFILL 10% NBF 60ML (FORM) ×4 IMPLANT
DILATOR CANAL MILEX (MISCELLANEOUS) ×2 IMPLANT
GLOVE BIO SURGEON STRL SZ7 (GLOVE) ×2 IMPLANT
GLOVE BIOGEL PI IND STRL 7.0 (GLOVE) ×2 IMPLANT
GLOVE BIOGEL PI INDICATOR 7.0 (GLOVE) ×2
GOWN STRL REUS W/TWL LRG LVL3 (GOWN DISPOSABLE) ×4 IMPLANT
IV NS IRRIG 3000ML ARTHROMATIC (IV SOLUTION) ×4 IMPLANT
PACK VAGINAL MINOR WOMEN LF (CUSTOM PROCEDURE TRAY) ×2 IMPLANT
PAD OB MATERNITY 4.3X12.25 (PERSONAL CARE ITEMS) ×2 IMPLANT
PAD PREP 24X48 CUFFED NSTRL (MISCELLANEOUS) ×2 IMPLANT
SET GENESYS HTA PROCERVA (MISCELLANEOUS) ×2 IMPLANT
TOWEL OR 17X24 6PK STRL BLUE (TOWEL DISPOSABLE) ×4 IMPLANT
TUBING AQUILEX INFLOW (TUBING) ×1 IMPLANT
TUBING AQUILEX OUTFLOW (TUBING) ×1 IMPLANT

## 2018-01-01 NOTE — Anesthesia Procedure Notes (Signed)
Procedure Name: LMA Insertion Date/Time: 01/01/2018 2:09 PM Performed by: Suan Halter, CRNA Pre-anesthesia Checklist: Patient identified, Emergency Drugs available, Suction available and Patient being monitored Patient Re-evaluated:Patient Re-evaluated prior to induction Oxygen Delivery Method: Circle system utilized Preoxygenation: Pre-oxygenation with 100% oxygen Induction Type: IV induction Ventilation: Mask ventilation without difficulty LMA: LMA inserted LMA Size: 4.0 Number of attempts: 1 Airway Equipment and Method: Bite block Placement Confirmation: positive ETCO2 Tube secured with: Tape Dental Injury: Teeth and Oropharynx as per pre-operative assessment

## 2018-01-01 NOTE — Discharge Instructions (Signed)
Call your surgeon if you experience:   1.  Fever over 101.0. 2.  Inability to urinate. 3.  Nausea and/or vomiting. 4.  Extreme swelling or bruising at the surgical site. 5.  Continued bleeding from the incision. 6.  Increased pain, redness or drainage from the incision. 7.  Problems related to your pain medication. 8.  Any problems and/or concerns Post Anesthesia Home Care Instructions  Activity: Get plenty of rest for the remainder of the day. A responsible individual must stay with you for 24 hours following the procedure.  For the next 24 hours, DO NOT: -Drive a car -Paediatric nurse -Drink alcoholic beverages -Take any medication unless instructed by your physician -Make any legal decisions or sign important papers.  Meals: Start with liquid foods such as gelatin or soup. Progress to regular foods as tolerated. Avoid greasy, spicy, heavy foods. If nausea and/or vomiting occur, drink only clear liquids until the nausea and/or vomiting subsides. Call your physician if vomiting continues.  Special Instructions/Symptoms: Your throat may feel dry or sore from the anesthesia or the breathing tube placed in your throat during surgery. If this causes discomfort, gargle with warm salt water. The discomfort should disappear within 24 hours.  If you had a scopolamine patch placed behind your ear for the management of post- operative nausea and/or vomiting:  1. The medication in the patch is effective for 72 hours, after which it should be removed.  Wrap patch in a tissue and discard in the trash. Wash hands thoroughly with soap and water. 2. You may remove the patch earlier than 72 hours if you experience unpleasant side effects which may include dry mouth, dizziness or visual disturbances. 3. Avoid touching the patch. Wash your hands with soap and water after contact with the patch.   Endometrial Ablation Endometrial ablation is a procedure that destroys the thin inner layer of the  lining of the uterus (endometrium). This procedure may be done:  To stop heavy periods.  To stop bleeding that is causing anemia.  To control irregular bleeding.  To treat bleeding caused by small tumors (fibroids) in the endometrium.  This procedure is often an alternative to major surgery, such as removal of the uterus and cervix (hysterectomy). As a result of this procedure:  You may not be able to have children. However, if you are premenopausal (you have not gone through menopause): ? You may still have a small chance of getting pregnant. ? You will need to use a reliable method of birth control after the procedure to prevent pregnancy.  You may stop having a menstrual period, or you may have only a small amount of bleeding during your period. Menstruation may return several years after the procedure.  Tell a health care provider about:  Any allergies you have.  All medicines you are taking, including vitamins, herbs, eye drops, creams, and over-the-counter medicines.  Any problems you or family members have had with the use of anesthetic medicines.  Any blood disorders you have.  Any surgeries you have had.  Any medical conditions you have. What are the risks? Generally, this is a safe procedure. However, problems may occur, including:  A hole (perforation) in the uterus or bowel.  Infection of the uterus, bladder, or vagina.  Bleeding.  Damage to other structures or organs.  An air bubble in the lung (air embolus).  Problems with pregnancy after the procedure.  Failure of the procedure.  Decreased ability to diagnose cancer in the endometrium.  What  happens before the procedure?  You will have tests of your endometrium to make sure there are no pre-cancerous cells or cancer cells present.  You may have an ultrasound of the uterus.  You may be given medicines to thin the endometrium.  Ask your health care provider about: ? Changing or stopping your  regular medicines. This is especially important if you take diabetes medicines or blood thinners. ? Taking medicines such as aspirin and ibuprofen. These medicines can thin your blood. Do not take these medicines before your procedure if your doctor tells you not to.  Plan to have someone take you home from the hospital or clinic. What happens during the procedure?  You will lie on an exam table with your feet and legs supported as in a pelvic exam.  To lower your risk of infection: ? Your health care team will wash or sanitize their hands and put on germ-free (sterile) gloves. ? Your genital area will be washed with soap.  An IV tube will be inserted into one of your veins.  You will be given a medicine to help you relax (sedative).  A surgical instrument with a light and camera (resectoscope) will be inserted into your vagina and moved into your uterus. This allows your surgeon to see inside your uterus.  Endometrial tissue will be removed using one of the following methods: ? Radiofrequency. This method uses a radiofrequency-alternating electric current to remove the endometrium. ? Cryotherapy. This method uses extreme cold to freeze the endometrium. ? Heated-free liquid. This method uses a heated saltwater (saline) solution to remove the endometrium. ? Microwave. This method uses high-energy microwaves to heat up the endometrium and remove it. ? Thermal balloon. This method involves inserting a catheter with a balloon tip into the uterus. The balloon tip is filled with heated fluid to remove the endometrium. The procedure may vary among health care providers and hospitals. What happens after the procedure?  Your blood pressure, heart rate, breathing rate, and blood oxygen level will be monitored until the medicines you were given have worn off.  As tissue healing occurs, you may notice vaginal bleeding for 4-6 weeks after the procedure. You may also experience: ? Cramps. ? Thin,  watery vaginal discharge that is light pink or brown in color. ? A need to urinate more frequently than usual. ? Nausea.  Do not drive for 24 hours if you were given a sedative.  Do not have sex or insert anything into your vagina until your health care provider approves. Summary  Endometrial ablation is done to treat the many causes of heavy menstrual bleeding.  The procedure may be done only after medications have been tried to control the bleeding.  Plan to have someone take you home from the hospital or clinic. This information is not intended to replace advice given to you by your health care provider. Make sure you discuss any questions you have with your health care provider. Document Released: 09/29/2004 Document Revised: 12/07/2016 Document Reviewed: 12/07/2016 [employer] Interactive Patient Education  2017 [employer].

## 2018-01-01 NOTE — Brief Op Note (Signed)
01/01/2018  3:08 PM  PATIENT:  Lisa Hull  52 y.o. female  PRE-OPERATIVE DIAGNOSIS:  N93.9 AUB, Fibroids  POST-OPERATIVE DIAGNOSIS: Same  PROCEDURE:  Procedure(s) with comments: DILATATION & CURETTAGE/HYSTEROSCOPY WITH HYDROTHERMAL ABLATION (N/A) - W/Myosure  SURGEON:  Surgeon(s) and Role:    Thurnell Lose, MD - Primary  PHYSICIAN ASSISTANT: None  ASSISTANTS: Technician   ANESTHESIA:   local and general  EBL:  10 ml   DEFICIT: 25 ML   BLOOD ADMINISTERED:none  DRAINS: None  LOCAL MEDICATIONS USED:  2% LIDOCAINE  and Amount: 10 ml  SPECIMEN:  Source of Specimen:  Endometrial currettings  DISPOSITION OF SPECIMEN:  PATHOLOGY  COUNTS:  YES  TOURNIQUET:  * No tourniquets in log *  DICTATION: . Dictation [id number]  PLAN OF CARE: Discharge to home after PACU  PATIENT DISPOSITION:  PACU - hemodynamically stable.   Delay start of Pharmacological VTE agent (>24hrs) due to surgical blood loss or risk of bleeding: yes

## 2018-01-01 NOTE — Interval H&P Note (Signed)
History and Physical Interval Note:  01/01/2018 2:02 PM  Lisa Hull  has presented today for surgery, with the diagnosis of N93.9 AUB  The various methods of treatment have been discussed with the patient and family. After consideration of risks, benefits and other options for treatment, the patient has consented to  Procedure(s) with comments: DILATATION & CURETTAGE/HYSTEROSCOPY WITH HYDROTHERMAL ABLATION (N/A) - W/Myosure as a surgical intervention .  The patient's history has been reviewed, patient examined, no change in status, stable for surgery.  I have reviewed the patient's chart and labs.  Questions were answered to the patient's satisfaction.     Thurnell Lose

## 2018-01-01 NOTE — Anesthesia Postprocedure Evaluation (Signed)
Anesthesia Post Note  Patient: Lisa Hull  Procedure(s) Performed: DILATATION & CURETTAGE/HYSTEROSCOPY WITH HYDROTHERMAL ABLATION (N/A )     Patient location during evaluation: PACU Anesthesia Type: General Level of consciousness: awake and alert Pain management: pain level controlled Vital Signs Assessment: post-procedure vital signs reviewed and stable Respiratory status: spontaneous breathing, nonlabored ventilation, respiratory function stable and patient connected to nasal cannula oxygen Cardiovascular status: blood pressure returned to baseline and stable Postop Assessment: no apparent nausea or vomiting Anesthetic complications: no    Last Vitals:  Vitals:   01/01/18 1545 01/01/18 1600  BP: 128/90 (!) 136/94  Pulse: 93 96  Resp: (!) 21 18  Temp:    SpO2: 100% 95%    Last Pain:  Vitals:   01/01/18 1209  TempSrc: Oral                 Hiren Peplinski EDWARD

## 2018-01-01 NOTE — H&P (Signed)
History of Present Illness  General:  Pt presents for preop evaluation for Hyst/D&C with HTA ablation.  Pt was orginally seen in September after PCP attempted an EMB. Pt was instructed to come back for EMB after pretreating with Cytotec. Pt did not premedicate with Cytotec as instructed and an attempt at EMB was unsuccessful and very painful for the patient. Due to AUB and thickened EMS on ultrasound, I recommended D&C. Pt deferred procedure b/c her husband lives in [place] and would not be able to come down to take her to surgery for several weeks. She then looked into getting a gyn to do it there but was unsuccessful.  Pt reports menses are lighter on OCPs. However, pt is interested in an HTA ablation so she can hopefully discontinue them.  From 08/2017 OV:  Pt has had light bleeding for the last 2 years. Usually spots. Just recently, she had a flood of bleeding. Wouldn't stop. Saw a gynecologist (NP) in [facility] and told she had fibroids, no further recommendations. Pt was already on OCPs. Offered hysterectomy.  Also has had an ultrasound here with [facility]. Ultrasound here was 06/2017. 5 fibroids seen, largest 5.9 cm. EMS 18 mm. OCPs have slowed the bleeding down. Pt had hot flashes prior to OCPs. PCP could not do office EMB.   Current Medications  Taking   Super B Complex Tablet as directed Orally DAILY, Notes: OTC   Claritin(Loratadine) 10 MG Tablet 1 tablet Orally Once a day   Multivitamins Tablet as directed Orally DAILY, Notes: OTC   Vitamin C once a day, Notes: OTC   Calcium + D(Calcium-Vitamin D) 400-200 Tablet 1 tablet with food Orally Once a day   Apri(Desogestrel-Ethinyl Estradiol) 0.15-30 MG-MCG Tablet 1 tablet Orally Once a day, Notes: MILLER   Montelukast Sodium 10 Tablet TAKE 1 TABLET BY MOUTH EVERY EVENING AT BEDTIME   Discontinued   Ferrous Sulfate 325 (65 Fe) MG Tablet 1 tablet Orally Once a day, Notes: MILLER, Pt has taken in 2 months   OTC , Notes: CODER LIVER  OIL   Cytotec(MiSOPROStol) 200 MCG Tablet 1 tablet vaginally Insert 24 and 12 hours prior to procedure   Medication List reviewed and reconciled with the patient    Past Medical History  Acne, hyperpigmentation, seb dermatits Dr. Leonie Green [facility].   hx of abnormal pap smear in 11/2004, which showed atypical squamous cells negative for high risk HPV; needs pap 3/14.   Right knee pain, left ankle pain.   Overweight.   Low HDL 3/13.   Irr bleeding, ? high [facility] 4/14, u/s 4/14 Multiple fibroids, endo lining 5.1 mm, EMB scanty cells, recheck in OV 4 mos and track menses, likely perimenopausal.   U/s 5/17 Endometrial stripe 4.4 mm.   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 Burna Mortimer in [place] .   u/s 7/18 5 fibroids, 67mm endometrial stripe, may need EMB, seeing ob/gyn 9/18 [facility] .   Colon 11/17/16 tub adenoma CT, repeat ? 5 yrs .           Surgical History  elective abortion 03/2001   Family History  Mother: arthritis mainly in knees, knee replacement  No family hx of colon cancer\/polyps or liver disease., denies any GYN family cancer hx.   Social History  General:  Tobacco use  cigarettes: Never smoked Tobacco history last updated 12/31/2017 no EXPOSURE TO PASSIVE SMOKE.  Alcohol: yes, rarely.  Caffeine: yes, occasionally.  no Recreational drug use.  DIET: no.  Exercise: yes.  Marital  Status: married.  Children: none.  OCCUPATION: team leader Scientific laboratory technician.    Gyn History  Sexual activity currently sexually active.  Periods : every 28 days.  LMP 12/25/2017.  Birth control ocp.  Last pap smear date 03/10/2013, 08/2017 Neg/HPV+.  Last mammogram date 08/03/2017.    OB History  Pregnancy # 1 abortion.  Pregnancy # 2 abortion.    Allergies  iodine: hives - Allergy  biotin: break outs - Allergy   Hospitalization/Major Diagnostic Procedure  None in the past year 08/2017  None 12/2017   Vital Signs  Wt 191, Wt change -24 lb, Ht 70.5, BMI 27.02,  Pulse sitting 79, BP sitting 110/80.   Physical Examination  GENERAL:  Patient appears alert and oriented.  General Appearance: well-appearing, well-developed, no acute distress.  Speech: clear.  LUNGS:  Auscultation: no wheezing/rhonchi/rales. CTA bilaterally.  HEART:  Heart sounds: normal. RRR. no murmur.  ABDOMEN:  General: soft nontender, nondistended, no masses.  FEMALE GENITOURINARY:  Pelvic vulva, vagina, normal. Large anterior fibroid.~ 5-7 cm. Uterus is irregular enlarged 10 weeks.  EXTREMITIES:  General: No edema or calf tenderness.     Assessments   1. Pre-operative clearance - Z01.818 (Primary)   2. Abnormal uterine bleeding (AUB) - N93.9, Requiring OCPs.   3. Fibroids - D21.9   Treatment  1. Pre-operative clearance  Continue Cytotec Tablet, 200 MCG, 1 tablet, vaginally, _insert 24 and 12 hours prior to procedure   2. Abnormal uterine bleeding (AUB)  Notes: Discussed R/B/A of procedure, including infection, bleeding and injury to the uterus. Suspect there may be some limitations due to large LUS fibroid and nulliparous cervix. I discussed risk of obscuring potential endometrial pathology with HTA ablation delaying diagnosis of carcinoma. Pt ok with that risk b/c she would like to discontinue OCPs. Pt also informed that if procedure is unsafe to perform, I will at minimum attempt EMB with pipelle. All questions answered.    Visit Codes  99213 OV LEVEL 3.    Follow Up  3 Weeks

## 2018-01-01 NOTE — Transfer of Care (Signed)
Immediate Anesthesia Transfer of Care Note  Patient: Lisa Hull  Procedure(s) Performed: Procedure(s) (LRB): DILATATION & CURETTAGE/HYSTEROSCOPY WITH HYDROTHERMAL ABLATION (N/A)  Patient Location: PACU  Anesthesia Type: General  Level of Consciousness: awake, oriented, sedated and patient cooperative  Airway & Oxygen Therapy: Patient Spontanous Breathing and Patient connected to face mask oxygen  Post-op Assessment: Report given to PACU RN and Post -op Vital signs reviewed and stable  Post vital signs: Reviewed and stable  Complications: No apparent anesthesia complications  Last Vitals:  Vitals:   01/01/18 1209 01/01/18 1518  BP: 127/80 131/86  Pulse: 92 (!) 106  Resp: 18 14  Temp: 36.9 C 37 C  SpO2: 100% 95%    Last Pain:  Vitals:   01/01/18 1209  TempSrc: Oral      Patients Stated Pain Goal: 7 (14/70/92 9574)  Complications: No Anesthesia complications

## 2018-01-01 NOTE — Anesthesia Preprocedure Evaluation (Signed)
Anesthesia Evaluation  Patient identified by MRN, date of birth, ID band Patient awake    Reviewed: Allergy & Precautions, NPO status , Patient's Chart, lab work & pertinent test results  Airway Mallampati: II  TM Distance: >3 FB Neck ROM: Full    Dental no notable dental hx.    Pulmonary neg pulmonary ROS,    Pulmonary exam normal breath sounds clear to auscultation       Cardiovascular negative cardio ROS Normal cardiovascular exam Rhythm:Regular Rate:Normal     Neuro/Psych negative neurological ROS  negative psych ROS   GI/Hepatic negative GI ROS, Neg liver ROS,   Endo/Other  negative endocrine ROS  Renal/GU negative Renal ROS  negative genitourinary   Musculoskeletal negative musculoskeletal ROS (+) Arthritis , Osteoarthritis,    Abdominal   Peds negative pediatric ROS (+)  Hematology negative hematology ROS (+)   Anesthesia Other Findings   Reproductive/Obstetrics negative OB ROS                             Anesthesia Physical Anesthesia Plan  ASA: II  Anesthesia Plan: General   Post-op Pain Management:    Induction: Intravenous  PONV Risk Score and Plan: 3 and Ondansetron, Dexamethasone and Midazolam  Airway Management Planned: LMA  Additional Equipment:   Intra-op Plan:   Post-operative Plan: Extubation in OR  Informed Consent: I have reviewed the patients History and Physical, chart, labs and discussed the procedure including the risks, benefits and alternatives for the proposed anesthesia with the patient or authorized representative who has indicated his/her understanding and acceptance.   Dental advisory given  Plan Discussed with: CRNA  Anesthesia Plan Comments:         Anesthesia Quick Evaluation

## 2018-01-02 ENCOUNTER — Encounter (HOSPITAL_BASED_OUTPATIENT_CLINIC_OR_DEPARTMENT_OTHER): Payer: Self-pay | Admitting: Obstetrics and Gynecology

## 2018-01-02 NOTE — Op Note (Signed)
Lisa Hull, Lisa Hull               ACCOUNT NO.:  [id number]  MEDICAL RECORD NO.:  [id number]  LOCATION:                                 FACILITY:  PHYSICIAN:  Jola Schmidt, MD        DATE OF BIRTH:  DATE OF PROCEDURE: DATE OF DISCHARGE:                              OPERATIVE REPORT   PREOPERATIVE DIAGNOSIS:  Abnormal uterine bleeding and fibroids.  POSTOPERATIVE DIAGNOSIS:  Abnormal uterine bleeding and fibroids.  PROCEDURE:  Hysteroscopy, D and C with hydrothermal ablation.  SURGEON:  Jola Schmidt, MD  ASSISTANT:  Technician.  ANESTHESIA:  Local and general.  Local is 2% lidocaine 10 mL.  ESTIMATED BLOOD LOSS:  10 mL.  BLOOD ADMINISTERED:  None.  DRAINS:  None.  DEFICIT:  25.  BLOOD ADMINISTERED:  None.  SPECIMEN:  Endometrial curettings.  DISPOSITION OF SPECIMEN:  To pathology.  COUNTS CORRECT:  Yes.  PATIENT DISPOSITION:  To PACU, hemodynamically stable.  COMPLICATIONS:  None.  FINDINGS:  Irregularly shaped endometrial cavity.  Normal-appearing endometrium, excessive amount.  No concerning lesions or masses.  DESCRIPTION OF PROCEDURE:  The patient was identified in the holding area.  She was then taken to the operating room with IV running.  She was placed in the dorsal lithotomy position and underwent general anesthesia without complication.  She was then prepped and draped in a normal sterile fashion.  SCDs were on her legs and operating and time- out was performed.  A Graves speculum was inserted into the vagina.  The cervix was identified and the anterior lip of the cervix was then injected with lidocaine and then grasped with a single-tooth tenaculum.  The cervical os was dilated with an os binder and the uterus was then gradually dilated up to 19.  The patient had a lower uterine segment fibroid, so I felt that there was a blockage when I attempted to dilate.  The hysteroscope was advanced to determine the pathway.  There the endometrium  was noted and the anterior fibroid was visualized.  It was not submucosal, so it was not anything that I could resect.  The hysteroscope was then removed.  She was dilated up to 23.  The HTA device was then advanced to the uterine fundus.  However, there was a clot that was on the end.  I gradually was able to shake that off and confirmed placement at the uterine fundus.  The hysteroscope with the HTA ablation was then activated.  Approximately 7 to 8 minutes into the ablation, there was a warning that it was kinked and checked tissue obstruction, there was no tissue in the scope.  We have started and stopped a few times and eventually the saline bag on the HTA device was held up in the air and then we eventually realized that the tubing to the HTA was what was kinked.  The ablation did complete a total of 10 minutes and appropriate cool down was performed.  Pictures were taken.  The hysteroscope was removed from the uterus.  A sharp curettage of the uterus was performed.  Of note, when I would go in, there was some bright red blood; however, it  was not actively oozing.  I did feel like I got an adequate and representative sampling of the endometrium.  All instruments were removed from the vagina.  When the tenaculum was removed, it was hemostatic.  The Graves was removed from the vagina.  All instrument, sponge, and needle counts were correct.  The patient tolerated the procedure well.  She went to the recovery room in stable condition.     Jola Schmidt, MD   ______________________________ Jola Schmidt, MD    EBV/MEDQ  D:  01/01/2018  T:  01/01/2018  Job:  [id number]

## 2018-01-28 DIAGNOSIS — Z9889 Other specified postprocedural states: Secondary | ICD-10-CM | POA: Diagnosis not present

## 2018-02-27 ENCOUNTER — Other Ambulatory Visit: Payer: BLUE CROSS/BLUE SHIELD

## 2018-03-13 DIAGNOSIS — L819 Disorder of pigmentation, unspecified: Secondary | ICD-10-CM | POA: Diagnosis not present

## 2018-03-13 DIAGNOSIS — L249 Irritant contact dermatitis, unspecified cause: Secondary | ICD-10-CM | POA: Diagnosis not present

## 2018-06-05 DIAGNOSIS — D219 Benign neoplasm of connective and other soft tissue, unspecified: Secondary | ICD-10-CM | POA: Diagnosis not present

## 2018-06-05 DIAGNOSIS — N920 Excessive and frequent menstruation with regular cycle: Secondary | ICD-10-CM | POA: Diagnosis not present

## 2018-07-23 DIAGNOSIS — S335XXA Sprain of ligaments of lumbar spine, initial encounter: Secondary | ICD-10-CM | POA: Diagnosis not present

## 2018-07-23 DIAGNOSIS — S134XXA Sprain of ligaments of cervical spine, initial encounter: Secondary | ICD-10-CM | POA: Diagnosis not present

## 2018-07-23 DIAGNOSIS — S161XXA Strain of muscle, fascia and tendon at neck level, initial encounter: Secondary | ICD-10-CM | POA: Diagnosis not present

## 2018-07-23 DIAGNOSIS — S233XXA Sprain of ligaments of thoracic spine, initial encounter: Secondary | ICD-10-CM | POA: Diagnosis not present

## 2018-07-29 DIAGNOSIS — S161XXA Strain of muscle, fascia and tendon at neck level, initial encounter: Secondary | ICD-10-CM | POA: Diagnosis not present

## 2018-07-29 DIAGNOSIS — S134XXA Sprain of ligaments of cervical spine, initial encounter: Secondary | ICD-10-CM | POA: Diagnosis not present

## 2018-07-29 DIAGNOSIS — M542 Cervicalgia: Secondary | ICD-10-CM | POA: Diagnosis not present

## 2018-07-29 DIAGNOSIS — S233XXA Sprain of ligaments of thoracic spine, initial encounter: Secondary | ICD-10-CM | POA: Diagnosis not present

## 2018-07-29 DIAGNOSIS — S335XXA Sprain of ligaments of lumbar spine, initial encounter: Secondary | ICD-10-CM | POA: Diagnosis not present

## 2018-08-07 DIAGNOSIS — S233XXA Sprain of ligaments of thoracic spine, initial encounter: Secondary | ICD-10-CM | POA: Diagnosis not present

## 2018-08-07 DIAGNOSIS — S335XXA Sprain of ligaments of lumbar spine, initial encounter: Secondary | ICD-10-CM | POA: Diagnosis not present

## 2018-08-07 DIAGNOSIS — S161XXA Strain of muscle, fascia and tendon at neck level, initial encounter: Secondary | ICD-10-CM | POA: Diagnosis not present

## 2018-08-07 DIAGNOSIS — S134XXA Sprain of ligaments of cervical spine, initial encounter: Secondary | ICD-10-CM | POA: Diagnosis not present

## 2018-08-12 DIAGNOSIS — S134XXA Sprain of ligaments of cervical spine, initial encounter: Secondary | ICD-10-CM | POA: Diagnosis not present

## 2018-08-12 DIAGNOSIS — S335XXA Sprain of ligaments of lumbar spine, initial encounter: Secondary | ICD-10-CM | POA: Diagnosis not present

## 2018-08-12 DIAGNOSIS — S233XXA Sprain of ligaments of thoracic spine, initial encounter: Secondary | ICD-10-CM | POA: Diagnosis not present

## 2018-08-12 DIAGNOSIS — S161XXA Strain of muscle, fascia and tendon at neck level, initial encounter: Secondary | ICD-10-CM | POA: Diagnosis not present

## 2018-08-13 DIAGNOSIS — R635 Abnormal weight gain: Secondary | ICD-10-CM | POA: Diagnosis not present

## 2018-08-13 DIAGNOSIS — Z131 Encounter for screening for diabetes mellitus: Secondary | ICD-10-CM | POA: Diagnosis not present

## 2018-08-13 DIAGNOSIS — Z23 Encounter for immunization: Secondary | ICD-10-CM | POA: Diagnosis not present

## 2018-08-13 DIAGNOSIS — Z13220 Encounter for screening for lipoid disorders: Secondary | ICD-10-CM | POA: Diagnosis not present

## 2018-08-13 DIAGNOSIS — Z0000 Encounter for general adult medical examination without abnormal findings: Secondary | ICD-10-CM | POA: Diagnosis not present

## 2018-08-19 ENCOUNTER — Other Ambulatory Visit (HOSPITAL_COMMUNITY)
Admission: RE | Admit: 2018-08-19 | Discharge: 2018-08-19 | Disposition: A | Discharge status: Home / Self Care | Payer: BLUE CROSS/BLUE SHIELD | Source: Ambulatory Visit | Attending: Obstetrics and Gynecology | Admitting: Obstetrics and Gynecology

## 2018-08-19 ENCOUNTER — Other Ambulatory Visit: Payer: Self-pay | Admitting: Obstetrics and Gynecology

## 2018-08-19 DIAGNOSIS — S161XXA Strain of muscle, fascia and tendon at neck level, initial encounter: Secondary | ICD-10-CM | POA: Diagnosis not present

## 2018-08-19 DIAGNOSIS — S134XXA Sprain of ligaments of cervical spine, initial encounter: Secondary | ICD-10-CM | POA: Diagnosis not present

## 2018-08-19 DIAGNOSIS — Z01411 Encounter for gynecological examination (general) (routine) with abnormal findings: Secondary | ICD-10-CM | POA: Diagnosis not present

## 2018-08-19 DIAGNOSIS — S335XXA Sprain of ligaments of lumbar spine, initial encounter: Secondary | ICD-10-CM | POA: Diagnosis not present

## 2018-08-19 DIAGNOSIS — S233XXA Sprain of ligaments of thoracic spine, initial encounter: Secondary | ICD-10-CM | POA: Diagnosis not present

## 2018-08-21 DIAGNOSIS — S335XXA Sprain of ligaments of lumbar spine, initial encounter: Secondary | ICD-10-CM | POA: Diagnosis not present

## 2018-08-21 DIAGNOSIS — S134XXA Sprain of ligaments of cervical spine, initial encounter: Secondary | ICD-10-CM | POA: Diagnosis not present

## 2018-08-21 DIAGNOSIS — S233XXA Sprain of ligaments of thoracic spine, initial encounter: Secondary | ICD-10-CM | POA: Diagnosis not present

## 2018-08-21 DIAGNOSIS — S161XXA Strain of muscle, fascia and tendon at neck level, initial encounter: Secondary | ICD-10-CM | POA: Diagnosis not present

## 2018-08-21 LAB — CYTOLOGY - PAP
DIAGNOSIS: NEGATIVE
HPV: NOT DETECTED

## 2018-08-23 DIAGNOSIS — Z1231 Encounter for screening mammogram for malignant neoplasm of breast: Secondary | ICD-10-CM | POA: Diagnosis not present

## 2018-08-26 DIAGNOSIS — S335XXA Sprain of ligaments of lumbar spine, initial encounter: Secondary | ICD-10-CM | POA: Diagnosis not present

## 2018-08-26 DIAGNOSIS — S233XXA Sprain of ligaments of thoracic spine, initial encounter: Secondary | ICD-10-CM | POA: Diagnosis not present

## 2018-08-26 DIAGNOSIS — S161XXA Strain of muscle, fascia and tendon at neck level, initial encounter: Secondary | ICD-10-CM | POA: Diagnosis not present

## 2018-08-26 DIAGNOSIS — S134XXA Sprain of ligaments of cervical spine, initial encounter: Secondary | ICD-10-CM | POA: Diagnosis not present

## 2018-09-02 DIAGNOSIS — D219 Benign neoplasm of connective and other soft tissue, unspecified: Secondary | ICD-10-CM | POA: Diagnosis not present

## 2018-09-02 DIAGNOSIS — S335XXA Sprain of ligaments of lumbar spine, initial encounter: Secondary | ICD-10-CM | POA: Diagnosis not present

## 2018-09-02 DIAGNOSIS — S161XXA Strain of muscle, fascia and tendon at neck level, initial encounter: Secondary | ICD-10-CM | POA: Diagnosis not present

## 2018-09-02 DIAGNOSIS — S134XXA Sprain of ligaments of cervical spine, initial encounter: Secondary | ICD-10-CM | POA: Diagnosis not present

## 2018-09-02 DIAGNOSIS — S233XXA Sprain of ligaments of thoracic spine, initial encounter: Secondary | ICD-10-CM | POA: Diagnosis not present

## 2018-09-06 DIAGNOSIS — J209 Acute bronchitis, unspecified: Secondary | ICD-10-CM | POA: Diagnosis not present

## 2018-09-09 DIAGNOSIS — S335XXA Sprain of ligaments of lumbar spine, initial encounter: Secondary | ICD-10-CM | POA: Diagnosis not present

## 2018-09-09 DIAGNOSIS — S233XXA Sprain of ligaments of thoracic spine, initial encounter: Secondary | ICD-10-CM | POA: Diagnosis not present

## 2018-09-09 DIAGNOSIS — S134XXA Sprain of ligaments of cervical spine, initial encounter: Secondary | ICD-10-CM | POA: Diagnosis not present

## 2018-09-09 DIAGNOSIS — S161XXA Strain of muscle, fascia and tendon at neck level, initial encounter: Secondary | ICD-10-CM | POA: Diagnosis not present

## 2018-09-30 DIAGNOSIS — S233XXA Sprain of ligaments of thoracic spine, initial encounter: Secondary | ICD-10-CM | POA: Diagnosis not present

## 2018-09-30 DIAGNOSIS — S161XXA Strain of muscle, fascia and tendon at neck level, initial encounter: Secondary | ICD-10-CM | POA: Diagnosis not present

## 2018-09-30 DIAGNOSIS — S134XXA Sprain of ligaments of cervical spine, initial encounter: Secondary | ICD-10-CM | POA: Diagnosis not present

## 2018-09-30 DIAGNOSIS — S335XXA Sprain of ligaments of lumbar spine, initial encounter: Secondary | ICD-10-CM | POA: Diagnosis not present

## 2018-10-09 DIAGNOSIS — L700 Acne vulgaris: Secondary | ICD-10-CM | POA: Diagnosis not present

## 2018-10-09 DIAGNOSIS — L219 Seborrheic dermatitis, unspecified: Secondary | ICD-10-CM | POA: Diagnosis not present

## 2018-10-09 DIAGNOSIS — L304 Erythema intertrigo: Secondary | ICD-10-CM | POA: Diagnosis not present

## 2018-10-09 DIAGNOSIS — L819 Disorder of pigmentation, unspecified: Secondary | ICD-10-CM | POA: Diagnosis not present

## 2018-10-21 DIAGNOSIS — S233XXA Sprain of ligaments of thoracic spine, initial encounter: Secondary | ICD-10-CM | POA: Diagnosis not present

## 2018-10-21 DIAGNOSIS — S161XXA Strain of muscle, fascia and tendon at neck level, initial encounter: Secondary | ICD-10-CM | POA: Diagnosis not present

## 2018-10-21 DIAGNOSIS — S335XXA Sprain of ligaments of lumbar spine, initial encounter: Secondary | ICD-10-CM | POA: Diagnosis not present

## 2018-10-21 DIAGNOSIS — S134XXA Sprain of ligaments of cervical spine, initial encounter: Secondary | ICD-10-CM | POA: Diagnosis not present

## 2018-11-06 DIAGNOSIS — S161XXA Strain of muscle, fascia and tendon at neck level, initial encounter: Secondary | ICD-10-CM | POA: Diagnosis not present

## 2018-11-06 DIAGNOSIS — S134XXA Sprain of ligaments of cervical spine, initial encounter: Secondary | ICD-10-CM | POA: Diagnosis not present

## 2018-11-06 DIAGNOSIS — S233XXA Sprain of ligaments of thoracic spine, initial encounter: Secondary | ICD-10-CM | POA: Diagnosis not present

## 2018-11-06 DIAGNOSIS — S335XXA Sprain of ligaments of lumbar spine, initial encounter: Secondary | ICD-10-CM | POA: Diagnosis not present

## 2018-11-11 DIAGNOSIS — S161XXA Strain of muscle, fascia and tendon at neck level, initial encounter: Secondary | ICD-10-CM | POA: Diagnosis not present

## 2018-11-11 DIAGNOSIS — S335XXA Sprain of ligaments of lumbar spine, initial encounter: Secondary | ICD-10-CM | POA: Diagnosis not present

## 2018-11-11 DIAGNOSIS — S134XXA Sprain of ligaments of cervical spine, initial encounter: Secondary | ICD-10-CM | POA: Diagnosis not present

## 2018-11-11 DIAGNOSIS — S233XXA Sprain of ligaments of thoracic spine, initial encounter: Secondary | ICD-10-CM | POA: Diagnosis not present

## 2018-11-18 DIAGNOSIS — S233XXA Sprain of ligaments of thoracic spine, initial encounter: Secondary | ICD-10-CM | POA: Diagnosis not present

## 2018-11-18 DIAGNOSIS — S134XXA Sprain of ligaments of cervical spine, initial encounter: Secondary | ICD-10-CM | POA: Diagnosis not present

## 2018-11-18 DIAGNOSIS — S161XXA Strain of muscle, fascia and tendon at neck level, initial encounter: Secondary | ICD-10-CM | POA: Diagnosis not present

## 2018-11-18 DIAGNOSIS — S335XXA Sprain of ligaments of lumbar spine, initial encounter: Secondary | ICD-10-CM | POA: Diagnosis not present

## 2018-11-25 DIAGNOSIS — S134XXA Sprain of ligaments of cervical spine, initial encounter: Secondary | ICD-10-CM | POA: Diagnosis not present

## 2018-11-25 DIAGNOSIS — S233XXA Sprain of ligaments of thoracic spine, initial encounter: Secondary | ICD-10-CM | POA: Diagnosis not present

## 2018-11-25 DIAGNOSIS — S161XXA Strain of muscle, fascia and tendon at neck level, initial encounter: Secondary | ICD-10-CM | POA: Diagnosis not present

## 2018-11-25 DIAGNOSIS — S335XXA Sprain of ligaments of lumbar spine, initial encounter: Secondary | ICD-10-CM | POA: Diagnosis not present

## 2019-08-06 DIAGNOSIS — D259 Leiomyoma of uterus, unspecified: Secondary | ICD-10-CM | POA: Diagnosis not present

## 2019-08-19 DIAGNOSIS — J302 Other seasonal allergic rhinitis: Secondary | ICD-10-CM | POA: Diagnosis not present

## 2019-08-19 DIAGNOSIS — Z86010 Personal history of colonic polyps: Secondary | ICD-10-CM | POA: Diagnosis not present

## 2019-08-27 DIAGNOSIS — Z1231 Encounter for screening mammogram for malignant neoplasm of breast: Secondary | ICD-10-CM | POA: Diagnosis not present

## 2019-10-21 ENCOUNTER — Other Ambulatory Visit: Payer: Self-pay | Admitting: Obstetrics and Gynecology

## 2019-10-21 ENCOUNTER — Other Ambulatory Visit (HOSPITAL_COMMUNITY)
Admission: RE | Admit: 2019-10-21 | Discharge: 2019-10-21 | Disposition: A | Discharge status: Home / Self Care | Payer: BLUE CROSS/BLUE SHIELD | Source: Ambulatory Visit | Attending: Obstetrics and Gynecology | Admitting: Obstetrics and Gynecology

## 2019-10-21 DIAGNOSIS — R8789 Other abnormal findings in specimens from female genital organs: Secondary | ICD-10-CM | POA: Insufficient documentation

## 2019-10-24 LAB — CYTOLOGY - PAP
Comment: NEGATIVE
Diagnosis: NEGATIVE
Diagnosis: REACTIVE
High risk HPV: NEGATIVE

## 2023-01-30 ENCOUNTER — Ambulatory Visit
Admission: EM | Admit: 2023-01-30 | Discharge: 2023-01-30 | Disposition: A | Discharge status: Home / Self Care | Payer: BLUE CROSS/BLUE SHIELD | Attending: Family Medicine | Admitting: Family Medicine

## 2023-01-30 ENCOUNTER — Encounter: Payer: Self-pay | Admitting: Emergency Medicine

## 2023-01-30 DIAGNOSIS — R21 Rash and other nonspecific skin eruption: Secondary | ICD-10-CM | POA: Diagnosis not present

## 2023-01-30 MED ORDER — FLUCONAZOLE 150 MG PO TABS: ORAL_TABLET | ORAL | 0 refills | Status: AC

## 2023-01-30 NOTE — ED Provider Notes (Signed)
[facility]    CSN: [id number] Arrival date & time: 01/30/23  1756      History   Chief Complaint Chief Complaint  Patient presents with   Rash    HPI Lisa Hull is a 57 y.o. female.   HPI  Patient has patches of rash all over.  Started on her right calf.  Now she has similar areas on her back and arms.  They are all circular.  Raised border.  Scaling center.  Patient evaluated her rash with an app on her phone and was diagnosed with atopic dermatitis.  I told her that this does look like nummular eczema, however, it is very difficult to tell this from a tinea reaction.  She has been using fluocinolone oil on it with no improvement (prescribed for her scalp)  Past Medical History:  Diagnosis Date   Acne    Hidradenitis suppurativa    Hyperpigmentation    Knee pain, bilateral    Osteoarthritis    right knee   Patellofemoral dysfunction of left knee     There are no problems to display for this patient.   Past Surgical History:  Procedure Laterality Date   COLONOSCOPY     DILITATION & CURRETTAGE/HYSTROSCOPY WITH HYDROTHERMAL ABLATION N/A 01/01/2018   Procedure: DILATATION & CURETTAGE/HYSTEROSCOPY WITH HYDROTHERMAL ABLATION;  Surgeon: Thurnell Lose, MD;  Location: [facility];  Service: Gynecology;  Laterality: N/A;   THERAPEUTIC ABORTION      OB History   No obstetric history on file.      Home Medications    Prior to Admission medications   Medication Sig Start Date End Date Taking? Authorizing Provider  Azelastine HCl 137 MCG/SPRAY SOLN SMARTSIG:1-2 Spray(s) Both Nares Daily 12/05/22  Yes [provider]  clindamycin (CLEOCIN T) 1 % external solution  04/05/15  Yes [provider]  Clindamycin-Benzoyl Per, Refr, gel Apply topically. 05/03/22  Yes [provider]  fluconazole (DIFLUCAN) 150 MG tablet Take 1 pill a week for 4 weeks 01/30/23  Yes Raylene Everts, MD  Fluocinolone Acetonide Body 0.01 % OIL   05/03/22  Yes [provider]  Pastos Take by mouth. 03/21/13  Yes [provider]  levocetirizine (XYZAL) 5 MG tablet  02/03/15  Yes [provider]  tretinoin (RETIN-A) 0.025 % cream  05/03/22  Yes [provider]  montelukast (SINGULAIR) 10 MG tablet Take 10 mg by mouth at bedtime.    [provider]  Multiple Vitamin (MULTIVITAMIN) capsule Take 1 capsule by mouth daily.    [provider]    Family History Family History  Problem Relation Age of Onset   Hypertension Father     Social History Social History   Tobacco Use   Smoking status: Never   Smokeless tobacco: Never  Vaping Use   Vaping Use: Never used  Substance Use Topics   Alcohol use: Yes    Comment: occ   Drug use: No     Allergies   Iodine   Review of Systems Review of Systems See HPI  Physical Exam Triage Vital Signs ED Triage Vitals  Enc Vitals Group     BP 01/30/23 1812 118/83     Pulse Rate 01/30/23 1812 82     Resp 01/30/23 1812 14     Temp 01/30/23 1812 99.6 F (37.6 C)     Temp Source 01/30/23 1812 Oral     SpO2 01/30/23 1812 97 %  Weight 01/30/23 1815 230 lb (104.3 kg)     Height 01/30/23 1815 '5\' 10"'$  (1.778 m)     Head Circumference --      Peak Flow --      Pain Score 01/30/23 1814 0     Pain Loc --      Pain Edu? --      Excl. in [place]? --    No data found.  Updated Vital Signs BP 118/83 (BP Location: Right Arm)   Pulse 82   Temp 99.6 F (37.6 C) (Oral)   Resp 14   Ht '5\' 10"'$  (1.778 m)   Wt 104.3 kg   LMP 12/18/2017 (Exact Date)   SpO2 97%   BMI 33.00 kg/m       Physical Exam Constitutional:      General: She is not in acute distress.    Appearance: She is well-developed.  HENT:     Head: Normocephalic and atraumatic.  Eyes:     Conjunctiva/sclera: Conjunctivae normal.     Pupils: Pupils are equal, round, and reactive to light.  Cardiovascular:     Rate and Rhythm: Normal rate.  Pulmonary:      Effort: Pulmonary effort is normal. No respiratory distress.  Abdominal:     General: There is no distension.     Palpations: Abdomen is soft.  Musculoskeletal:        General: Normal range of motion.     Cervical back: Normal range of motion.  Skin:    General: Skin is warm and dry.     Findings: Lesion present.     Comments: Multiple circular lesions from 1 cm to 3 cm areas are crossed with a raised border and scaling center.  Neurological:     Mental Status: She is alert.      UC Treatments / Results  Labs (all labs ordered are listed, but only abnormal results are displayed) Labs Reviewed - No data to display  EKG   Radiology No results found.  Procedures Procedures (including critical care time)  Medications Ordered in UC Medications - No data to display  Initial Impression / Assessment and Plan / UC Course  I have reviewed the triage vital signs and the nursing notes.  Pertinent labs & imaging results that were available during my care of the patient were reviewed by me and considered in my medical decision making (see chart for details).     Final Clinical Impressions(s) / UC Diagnoses   Final diagnoses:  Rash     Discharge Instructions      It is difficult to tell tinea corporis (ringworm) from atopic dermatitis (eczema) Eczema should get better with a cortisone or steroid cream Continue is treated with oral antifungal medicine when it is widespread I have prescribed Diflucan to take once a week for 4 weeks May continue to use other lotions See your dermatologist if this fails to improve   ED Prescriptions     Medication Sig Dispense Auth. Provider   fluconazole (DIFLUCAN) 150 MG tablet Take 1 pill a week for 4 weeks 4 tablet Raylene Everts, MD      PDMP not reviewed this encounter.   Raylene Everts, MD 01/30/23 [phone]

## 2023-01-30 NOTE — ED Triage Notes (Signed)
Dry scaly patch to RLE since 01/06/23 Started out as a red patch Pt states she has some on back, arms & breasts  Pt has concerns for skin cancer  Unable to see her dermatologist until July

## 2023-01-30 NOTE — Discharge Instructions (Signed)
It is difficult to tell tinea corporis (ringworm) from atopic dermatitis (eczema) Eczema should get better with a cortisone or steroid cream Continue is treated with oral antifungal medicine when it is widespread I have prescribed Diflucan to take once a week for 4 weeks May continue to use other lotions See your dermatologist if this fails to improve

## 2023-02-07 ENCOUNTER — Other Ambulatory Visit (HOSPITAL_BASED_OUTPATIENT_CLINIC_OR_DEPARTMENT_OTHER): Payer: Self-pay | Admitting: Family Medicine

## 2023-02-07 DIAGNOSIS — E7800 Pure hypercholesterolemia, unspecified: Secondary | ICD-10-CM

## 2023-02-20 ENCOUNTER — Ambulatory Visit (INDEPENDENT_AMBULATORY_CARE_PROVIDER_SITE_OTHER): Payer: Self-pay

## 2023-02-20 DIAGNOSIS — E7800 Pure hypercholesterolemia, unspecified: Secondary | ICD-10-CM

## 2024-03-24 ENCOUNTER — Other Ambulatory Visit (HOSPITAL_COMMUNITY): Payer: Self-pay | Admitting: Family Medicine

## 2024-03-24 DIAGNOSIS — E0590 Thyrotoxicosis, unspecified without thyrotoxic crisis or storm: Secondary | ICD-10-CM

## 2024-04-14 ENCOUNTER — Encounter (HOSPITAL_COMMUNITY)
Admission: RE | Admit: 2024-04-14 | Discharge: 2024-04-14 | Disposition: A | Discharge status: Home / Self Care | Source: Ambulatory Visit | Attending: Family Medicine | Admitting: Family Medicine

## 2024-04-14 DIAGNOSIS — E0590 Thyrotoxicosis, unspecified without thyrotoxic crisis or storm: Secondary | ICD-10-CM | POA: Insufficient documentation

## 2024-04-14 MED ORDER — SODIUM IODIDE I-123 7.4 MBQ CAPS
400.0000 | ORAL_CAPSULE | Freq: Once | ORAL | Status: AC
  Administered 2024-04-14: 400 via ORAL

## 2024-04-15 ENCOUNTER — Encounter (HOSPITAL_COMMUNITY)
Admission: RE | Admit: 2024-04-15 | Discharge: 2024-04-15 | Disposition: A | Discharge status: Home / Self Care | Source: Ambulatory Visit | Attending: Family Medicine | Admitting: Family Medicine
